# Patient Record
Sex: Female | Born: 1990 | Hispanic: Yes | Marital: Married | Smoking: Never smoker
Health system: Southern US, Community
[De-identification: ages and names within clinical notes are randomized; demographics above are authoritative.]

## PROBLEM LIST (undated history)

## (undated) ENCOUNTER — Inpatient Hospital Stay: Payer: Self-pay

## (undated) DIAGNOSIS — Z8279 Family history of other congenital malformations, deformations and chromosomal abnormalities: Secondary | ICD-10-CM

## (undated) HISTORY — PX: NO PAST SURGERIES: SHX2092

## (undated) HISTORY — DX: Family history of other congenital malformations, deformations and chromosomal abnormalities: Z82.79

---
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
## 2010-05-15 ENCOUNTER — Emergency Department: Payer: Self-pay | Admitting: Emergency Medicine

## 2011-04-09 ENCOUNTER — Emergency Department: Payer: Self-pay | Admitting: Emergency Medicine

## 2012-11-26 ENCOUNTER — Emergency Department: Payer: Self-pay | Admitting: Emergency Medicine

## 2014-06-28 ENCOUNTER — Ambulatory Visit: Payer: Self-pay | Admitting: Otolaryngology

## 2016-01-29 DIAGNOSIS — N941 Unspecified dyspareunia: Secondary | ICD-10-CM | POA: Diagnosis not present

## 2016-01-29 DIAGNOSIS — Z3202 Encounter for pregnancy test, result negative: Secondary | ICD-10-CM | POA: Diagnosis not present

## 2016-01-29 DIAGNOSIS — Z30431 Encounter for routine checking of intrauterine contraceptive device: Secondary | ICD-10-CM | POA: Diagnosis not present

## 2016-02-05 DIAGNOSIS — R1032 Left lower quadrant pain: Secondary | ICD-10-CM | POA: Diagnosis not present

## 2016-02-05 DIAGNOSIS — N941 Unspecified dyspareunia: Secondary | ICD-10-CM | POA: Diagnosis not present

## 2016-02-05 DIAGNOSIS — N83202 Unspecified ovarian cyst, left side: Secondary | ICD-10-CM | POA: Diagnosis not present

## 2016-04-01 DIAGNOSIS — M5412 Radiculopathy, cervical region: Secondary | ICD-10-CM | POA: Diagnosis not present

## 2016-04-01 DIAGNOSIS — M9901 Segmental and somatic dysfunction of cervical region: Secondary | ICD-10-CM | POA: Diagnosis not present

## 2016-04-01 DIAGNOSIS — M62830 Muscle spasm of back: Secondary | ICD-10-CM | POA: Diagnosis not present

## 2016-04-01 DIAGNOSIS — M9902 Segmental and somatic dysfunction of thoracic region: Secondary | ICD-10-CM | POA: Diagnosis not present

## 2016-04-08 DIAGNOSIS — M62830 Muscle spasm of back: Secondary | ICD-10-CM | POA: Diagnosis not present

## 2016-04-08 DIAGNOSIS — M5412 Radiculopathy, cervical region: Secondary | ICD-10-CM | POA: Diagnosis not present

## 2016-04-08 DIAGNOSIS — M9901 Segmental and somatic dysfunction of cervical region: Secondary | ICD-10-CM | POA: Diagnosis not present

## 2016-04-08 DIAGNOSIS — M9902 Segmental and somatic dysfunction of thoracic region: Secondary | ICD-10-CM | POA: Diagnosis not present

## 2016-04-10 DIAGNOSIS — M9901 Segmental and somatic dysfunction of cervical region: Secondary | ICD-10-CM | POA: Diagnosis not present

## 2016-04-10 DIAGNOSIS — M9902 Segmental and somatic dysfunction of thoracic region: Secondary | ICD-10-CM | POA: Diagnosis not present

## 2016-04-10 DIAGNOSIS — M62830 Muscle spasm of back: Secondary | ICD-10-CM | POA: Diagnosis not present

## 2016-04-10 DIAGNOSIS — M5412 Radiculopathy, cervical region: Secondary | ICD-10-CM | POA: Diagnosis not present

## 2016-04-13 DIAGNOSIS — M9901 Segmental and somatic dysfunction of cervical region: Secondary | ICD-10-CM | POA: Diagnosis not present

## 2016-04-13 DIAGNOSIS — M62830 Muscle spasm of back: Secondary | ICD-10-CM | POA: Diagnosis not present

## 2016-04-13 DIAGNOSIS — M5412 Radiculopathy, cervical region: Secondary | ICD-10-CM | POA: Diagnosis not present

## 2016-04-13 DIAGNOSIS — M9902 Segmental and somatic dysfunction of thoracic region: Secondary | ICD-10-CM | POA: Diagnosis not present

## 2016-04-15 DIAGNOSIS — R1032 Left lower quadrant pain: Secondary | ICD-10-CM | POA: Diagnosis not present

## 2016-04-15 DIAGNOSIS — Z30432 Encounter for removal of intrauterine contraceptive device: Secondary | ICD-10-CM | POA: Diagnosis not present

## 2016-04-15 DIAGNOSIS — N83201 Unspecified ovarian cyst, right side: Secondary | ICD-10-CM | POA: Diagnosis not present

## 2016-08-18 DIAGNOSIS — Z331 Pregnant state, incidental: Secondary | ICD-10-CM | POA: Diagnosis not present

## 2016-08-18 DIAGNOSIS — Z113 Encounter for screening for infections with a predominantly sexual mode of transmission: Secondary | ICD-10-CM | POA: Diagnosis not present

## 2016-08-18 DIAGNOSIS — Z124 Encounter for screening for malignant neoplasm of cervix: Secondary | ICD-10-CM | POA: Diagnosis not present

## 2016-08-18 DIAGNOSIS — Z3480 Encounter for supervision of other normal pregnancy, unspecified trimester: Secondary | ICD-10-CM | POA: Diagnosis not present

## 2016-08-18 LAB — OB RESULTS CONSOLE ABO/RH: RH Type: POSITIVE

## 2016-08-18 LAB — OB RESULTS CONSOLE RPR: RPR: NONREACTIVE

## 2016-08-18 LAB — OB RESULTS CONSOLE HIV ANTIBODY (ROUTINE TESTING): HIV: NONREACTIVE

## 2016-08-18 LAB — HM PAP SMEAR

## 2016-08-18 LAB — OB RESULTS CONSOLE PLATELET COUNT: PLATELETS: 226 10*3/uL

## 2016-08-18 LAB — OB RESULTS CONSOLE HGB/HCT, BLOOD
HEMATOCRIT: 35 %
Hemoglobin: 12.3 g/dL

## 2016-08-18 LAB — OB RESULTS CONSOLE ANTIBODY SCREEN: Antibody Screen: NEGATIVE

## 2016-08-18 LAB — OB RESULTS CONSOLE RUBELLA ANTIBODY, IGM: RUBELLA: IMMUNE

## 2016-08-18 LAB — OB RESULTS CONSOLE HEPATITIS B SURFACE ANTIGEN: Hepatitis B Surface Ag: NEGATIVE

## 2016-08-18 LAB — OB RESULTS CONSOLE VARICELLA ZOSTER ANTIBODY, IGG: Varicella: IMMUNE

## 2016-08-18 LAB — OB RESULTS CONSOLE GC/CHLAMYDIA
CHLAMYDIA, DNA PROBE: NEGATIVE
GC PROBE AMP, GENITAL: NEGATIVE

## 2016-09-04 DIAGNOSIS — Z3687 Encounter for antenatal screening for uncertain dates: Secondary | ICD-10-CM | POA: Diagnosis not present

## 2016-09-04 DIAGNOSIS — Z3A11 11 weeks gestation of pregnancy: Secondary | ICD-10-CM | POA: Diagnosis not present

## 2016-09-04 DIAGNOSIS — Z8279 Family history of other congenital malformations, deformations and chromosomal abnormalities: Secondary | ICD-10-CM | POA: Diagnosis not present

## 2016-10-05 NOTE — L&D Delivery Note (Signed)
Delivery Note At 9:40 PM a viable female infant was delivered via Vaginal, Spontaneous Delivery (Presentation: ROA).  APGAR: 8, 9 ; weight pending .   Placenta status: delivered spontaneously, intact.  Cord: 3VC with the following complications: none.  Cord pH: n/a  Anesthesia:  epidural Episiotomy: None Lacerations: 1st degree;Perineal Suture Repair: not repaired, hemostatic Est. Blood Loss (mL): 350  Mom to postpartum.  Baby to Couplet care / Skin to Skin.  Called to see patient.  Mom pushed to deliver a viable female infant.  The head followed by shoulders, which delivered without difficulty, and the rest of the body.  A single nuchal cord noted and reduced.  Baby to mom's chest.  Cord clamped and cut after > 1 min delay.  Cord blood obtained.  Placenta delivered spontaneously, intact, with a 3-vessel cord.  First degree perineal laceration repaired with 3-0 Vicryl in standard fashion.  Rest of vagina and cervix inspected without lacerations noted. All counts correct.  Hemostasis obtained with IV pitocin and fundal massage. EBL 350 mL.     Thomasene MohairStephen Cherly Erno, MD 04/04/2017, 9:54 PM

## 2016-11-04 DIAGNOSIS — Z3A19 19 weeks gestation of pregnancy: Secondary | ICD-10-CM | POA: Diagnosis not present

## 2016-11-04 DIAGNOSIS — Z8279 Family history of other congenital malformations, deformations and chromosomal abnormalities: Secondary | ICD-10-CM | POA: Diagnosis not present

## 2016-11-04 DIAGNOSIS — Z369 Encounter for antenatal screening, unspecified: Secondary | ICD-10-CM | POA: Diagnosis not present

## 2016-11-04 DIAGNOSIS — Z23 Encounter for immunization: Secondary | ICD-10-CM | POA: Diagnosis not present

## 2016-11-23 DIAGNOSIS — Z3A22 22 weeks gestation of pregnancy: Secondary | ICD-10-CM | POA: Diagnosis not present

## 2016-11-23 DIAGNOSIS — Z362 Encounter for other antenatal screening follow-up: Secondary | ICD-10-CM | POA: Diagnosis not present

## 2016-11-26 DIAGNOSIS — Z3492 Encounter for supervision of normal pregnancy, unspecified, second trimester: Secondary | ICD-10-CM | POA: Diagnosis not present

## 2016-11-26 DIAGNOSIS — Z0000 Encounter for general adult medical examination without abnormal findings: Secondary | ICD-10-CM | POA: Diagnosis not present

## 2016-11-26 DIAGNOSIS — Z3A22 22 weeks gestation of pregnancy: Secondary | ICD-10-CM | POA: Diagnosis not present

## 2016-11-26 LAB — LIPID PANEL
CHOLESTEROL: 202 mg/dL — AB (ref 0–200)
HDL: 79 mg/dL — AB (ref 35–70)
LDL Cholesterol: 89 mg/dL
Triglycerides: 169 mg/dL — AB (ref 40–160)

## 2016-12-15 ENCOUNTER — Observation Stay
Admission: EM | Admit: 2016-12-15 | Discharge: 2016-12-16 | Disposition: A | Discharge status: Home / Self Care | Payer: BLUE CROSS/BLUE SHIELD | Attending: Obstetrics and Gynecology | Admitting: Obstetrics and Gynecology

## 2016-12-15 DIAGNOSIS — R109 Unspecified abdominal pain: Secondary | ICD-10-CM | POA: Diagnosis not present

## 2016-12-15 DIAGNOSIS — O26892 Other specified pregnancy related conditions, second trimester: Secondary | ICD-10-CM | POA: Diagnosis not present

## 2016-12-15 DIAGNOSIS — O9989 Other specified diseases and conditions complicating pregnancy, childbirth and the puerperium: Secondary | ICD-10-CM | POA: Diagnosis not present

## 2016-12-15 DIAGNOSIS — Z3A25 25 weeks gestation of pregnancy: Secondary | ICD-10-CM | POA: Insufficient documentation

## 2016-12-15 DIAGNOSIS — R102 Pelvic and perineal pain: Secondary | ICD-10-CM | POA: Diagnosis not present

## 2016-12-15 MED ORDER — ACETAMINOPHEN 325 MG PO TABS: 650.0000 mg | ORAL_TABLET | ORAL | Status: DC | PRN

## 2016-12-16 DIAGNOSIS — R102 Pelvic and perineal pain: Secondary | ICD-10-CM | POA: Diagnosis not present

## 2016-12-16 DIAGNOSIS — Z3A25 25 weeks gestation of pregnancy: Secondary | ICD-10-CM | POA: Diagnosis not present

## 2016-12-16 DIAGNOSIS — O26892 Other specified pregnancy related conditions, second trimester: Secondary | ICD-10-CM | POA: Diagnosis not present

## 2016-12-16 NOTE — Discharge Instructions (Signed)
Round Ligament Pain The round ligament is a cord of muscle and tissue that helps to support the uterus. It can become a source of pain during pregnancy if it becomes stretched or twisted as the baby grows. The pain usually begins in the second trimester of pregnancy, and it can come and go until the baby is delivered. It is not a serious problem, and it does not cause harm to the baby. Round ligament pain is usually a short, sharp, and pinching pain, but it can also be a dull, lingering, and aching pain. The pain is felt in the lower side of the abdomen or in the groin. It usually starts deep in the groin and moves up to the outside of the hip area. Pain can occur with:  A sudden change in position.  Rolling over in bed.  Coughing or sneezing.  Physical activity. Follow these instructions at home: Watch your condition for any changes. Take these steps to help with your pain:  When the pain starts, relax. Then try:  Sitting down.  Flexing your knees up to your abdomen.  Lying on your side with one pillow under your abdomen and another pillow between your legs.  Sitting in a warm bath for 15-20 minutes or until the pain goes away.  Take over-the-counter and prescription medicines only as told by your health care provider.  Move slowly when you sit and stand.  Avoid long walks if they cause pain.  Stop or lessen your physical activities if they cause pain. Contact a health care provider if:  Your pain does not go away with treatment.  You feel pain in your back that you did not have before.  Your medicine is not helping. Get help right away if:  You develop a fever or chills.  You develop uterine contractions.  You develop vaginal bleeding.  You develop nausea or vomiting.  You develop diarrhea.  You have pain when you urinate. This information is not intended to replace advice given to you by your health care provider. Make sure you discuss any questions you have with  your health care provider. Document Released: 06/30/2008 Document Revised: 02/27/2016 Document Reviewed: 11/28/2014 Elsevier Interactive Patient Education  2017 [employer]  

## 2016-12-16 NOTE — Progress Notes (Signed)
Patient came in complaining of constant vaginal pressure and lower abdominal pain for the last month. Patient states previously she was able to alleviate pain by changing position but has increased over the last 2 weeks to the point where she cant get comfortable and rates pain 10/10. Characterizes pain as pulling, aching and shooting in groin and vagina radiating to lower back. Also has occasional numbness in left leg.  Endorses fetal movement. Denies vaginal bleeding, LOF.  Monitors applied.

## 2016-12-16 NOTE — Final Progress Note (Signed)
Physician Final Progress Note  Patient ID: Vicki Lutz MRN: [id number] DOB/AGE: 26/04/1991 25 y.o.  Admit date: 12/15/2016 Admitting provider: Vena AustriaAndreas Shontay Wallner, MD Discharge date: 12/16/2016   Admission Diagnoses: lower abdominal pain   Discharge Diagnoses:  Active Problems:   Labor and delivery indication for care or intervention round ligament pain  26 yo G3P1 at 461w5d presenting with bilateral lower quadrant/groin pain, described as sharp, worse with movement.  No dysuria, some lumbago no CVA, no no hematuria.  +FM, no LOF, no VB, no ctx  Consults: None  Significant Findings/ Diagnostic Studies: none  Procedures:  150, moderate, no accels, no decels no contractions on tocometer   Discharge Condition: good  Disposition: Final discharge disposition not confirmed  Diet: Regular diet  Discharge Activity: Activity as tolerated  Discharge Instructions    Discharge activity:  No Restrictions    Complete by:  As directed    Discharge diet:  No restrictions    Complete by:  As directed    No sexual activity restrictions    Complete by:  As directed    Notify physician for a general feeling that "something is not right"    Complete by:  As directed    Notify physician for increase or change in vaginal discharge    Complete by:  As directed    Notify physician for intestinal cramps, with or without diarrhea, sometimes described as "gas pain"    Complete by:  As directed    Notify physician for leaking of fluid    Complete by:  As directed    Notify physician for low, dull backache, unrelieved by heat or Tylenol    Complete by:  As directed    Notify physician for menstrual like cramps    Complete by:  As directed    Notify physician for pelvic pressure    Complete by:  As directed    Notify physician for uterine contractions.  These may be painless and feel like the uterus is tightening or the baby is  "balling up"    Complete by:  As directed    Notify physician for  vaginal bleeding    Complete by:  As directed    PRETERM LABOR:  Includes any of the follwing symptoms that occur between 20 - [redacted] weeks gestation.  If these symptoms are not stopped, preterm labor can result in preterm delivery, placing your baby at risk    Complete by:  As directed      Allergies as of 12/16/2016   Not on File     Medication List    You have not been prescribed any medications.      Total time spent taking care of this patient: 30 minutes  Signed: Drinda ButtsAmdreas Jacqulene Lutz 12/16/2016, 12:19 AM

## 2016-12-16 NOTE — Progress Notes (Signed)
Patient declines tylenol, heating pad at this time, states she has both at home. Discharge instructions reviewed with patient, no questions at this time. Patient escorted via wheelchair to emergency room lobby without difficulty at her request. Patient and husband ambulatory out of hospital at this time.

## 2016-12-17 ENCOUNTER — Ambulatory Visit (INDEPENDENT_AMBULATORY_CARE_PROVIDER_SITE_OTHER): Payer: BLUE CROSS/BLUE SHIELD | Admitting: Obstetrics and Gynecology

## 2016-12-17 VITALS — BP 100/62 | Ht 61.0 in | Wt 152.0 lb

## 2016-12-17 DIAGNOSIS — R109 Unspecified abdominal pain: Secondary | ICD-10-CM

## 2016-12-17 DIAGNOSIS — O26892 Other specified pregnancy related conditions, second trimester: Secondary | ICD-10-CM

## 2016-12-17 LAB — POCT URINALYSIS DIPSTICK
Bilirubin, UA: NEGATIVE
Glucose, UA: NEGATIVE
Ketones, UA: NEGATIVE
Leukocytes, UA: NEGATIVE
NITRITE UA: NEGATIVE
RBC UA: NEGATIVE
Spec Grav, UA: 1.01
UROBILINOGEN UA: NEGATIVE
pH, UA: 7

## 2016-12-17 NOTE — Progress Notes (Signed)
Pt complains of severe low abdomen pain unrelieved by Tylenol or heat.   The patient complains of worsening abdominal pain and pressure over the past month. She describes the pain as a heavy pressure that is always present. The pain level is 6/10 at best and 10/10 at worst. The pain is located in her lower abdomen worse on her left side and radiates down her left leg. Lying inserted and positions makes the pain a little better as well as using a heating pad. She has tried Tylenol with slight improvement. She has not identified activities or thing she can do to make the pain worse she denies any associated symptoms. She denies fever, chills, headache, nausea, vomiting, constipation, diarrhea, urinary symptoms, vaginal symptoms. She has a good appetite and is able to eat. Exam: Abdomen: Soft, mildly tender to palpation in the left lower quadrant, positive bowel sounds, no rebound, no guarding, no masses felt To the uterus. There is no uterine tenderness to palpation. Plan: Urinalysis negative for infection. Will obtain ultrasound to assess for adnexal cysts. Precautions for worsening pain were discussed with patient. She is to go to the hospital if pain progresses and does not relent. She voiced understanding and agreement with this plan. The ultrasound will also asses for concealed abruption. Mutual agreement to defer her pelvic exam today.

## 2016-12-18 ENCOUNTER — Ambulatory Visit (INDEPENDENT_AMBULATORY_CARE_PROVIDER_SITE_OTHER): Payer: BLUE CROSS/BLUE SHIELD | Admitting: Obstetrics and Gynecology

## 2016-12-18 ENCOUNTER — Ambulatory Visit (INDEPENDENT_AMBULATORY_CARE_PROVIDER_SITE_OTHER): Payer: BLUE CROSS/BLUE SHIELD

## 2016-12-18 VITALS — BP 100/60 | Wt 149.0 lb

## 2016-12-18 DIAGNOSIS — R109 Unspecified abdominal pain: Secondary | ICD-10-CM

## 2016-12-18 DIAGNOSIS — Z3A26 26 weeks gestation of pregnancy: Secondary | ICD-10-CM

## 2016-12-18 DIAGNOSIS — O26892 Other specified pregnancy related conditions, second trimester: Secondary | ICD-10-CM | POA: Diagnosis not present

## 2016-12-18 NOTE — Progress Notes (Signed)
U/S today shows ?retroplacental hematoma.  See report.  Pt has had absolutenly no bleeding, no ctx, no uterine tenderness. I believe this is less likely a hematoma and more likely a venous sinus near the endometrium. To be safe, patient given precautions for vaginal bleeding or worsening sx.  Patient to keep u/s appt next week to have this rechecked.

## 2016-12-24 ENCOUNTER — Ambulatory Visit (INDEPENDENT_AMBULATORY_CARE_PROVIDER_SITE_OTHER): Payer: BLUE CROSS/BLUE SHIELD | Admitting: Obstetrics and Gynecology

## 2016-12-24 ENCOUNTER — Other Ambulatory Visit: Payer: Self-pay | Admitting: Obstetrics and Gynecology

## 2016-12-24 ENCOUNTER — Ambulatory Visit (INDEPENDENT_AMBULATORY_CARE_PROVIDER_SITE_OTHER): Payer: BLUE CROSS/BLUE SHIELD

## 2016-12-24 VITALS — BP 104/62 | Wt 151.0 lb

## 2016-12-24 DIAGNOSIS — R109 Unspecified abdominal pain: Secondary | ICD-10-CM

## 2016-12-24 DIAGNOSIS — Z131 Encounter for screening for diabetes mellitus: Secondary | ICD-10-CM

## 2016-12-24 DIAGNOSIS — Z3689 Encounter for other specified antenatal screening: Secondary | ICD-10-CM

## 2016-12-24 DIAGNOSIS — Z3A26 26 weeks gestation of pregnancy: Secondary | ICD-10-CM

## 2016-12-24 DIAGNOSIS — Z3482 Encounter for supervision of other normal pregnancy, second trimester: Secondary | ICD-10-CM

## 2016-12-24 DIAGNOSIS — Z113 Encounter for screening for infections with a predominantly sexual mode of transmission: Secondary | ICD-10-CM

## 2016-12-24 DIAGNOSIS — O26892 Other specified pregnancy related conditions, second trimester: Secondary | ICD-10-CM

## 2016-12-24 DIAGNOSIS — Z3A20 20 weeks gestation of pregnancy: Secondary | ICD-10-CM | POA: Diagnosis not present

## 2016-12-24 NOTE — Progress Notes (Signed)
u/s shows decrease in size of ?retroplacenta hematoma (was 0.92 x 6cm - now is 0.8 x 3.3cm) No vb. No lof. Anatomy scan still incomplete for RVOT. Will repeat in 2 weeks to see RVOT and assess retroplacental hematoma. 28 week labs nv.

## 2017-01-11 ENCOUNTER — Encounter: Payer: BLUE CROSS/BLUE SHIELD | Admitting: Certified Nurse Midwife

## 2017-01-11 ENCOUNTER — Other Ambulatory Visit: Payer: BLUE CROSS/BLUE SHIELD

## 2017-01-12 ENCOUNTER — Ambulatory Visit (INDEPENDENT_AMBULATORY_CARE_PROVIDER_SITE_OTHER): Payer: BLUE CROSS/BLUE SHIELD

## 2017-01-12 ENCOUNTER — Ambulatory Visit: Payer: BLUE CROSS/BLUE SHIELD | Admitting: Obstetrics and Gynecology

## 2017-01-12 DIAGNOSIS — Z131 Encounter for screening for diabetes mellitus: Secondary | ICD-10-CM

## 2017-01-12 DIAGNOSIS — Z113 Encounter for screening for infections with a predominantly sexual mode of transmission: Secondary | ICD-10-CM

## 2017-01-12 DIAGNOSIS — Z3482 Encounter for supervision of other normal pregnancy, second trimester: Secondary | ICD-10-CM | POA: Diagnosis not present

## 2017-01-13 ENCOUNTER — Ambulatory Visit (INDEPENDENT_AMBULATORY_CARE_PROVIDER_SITE_OTHER): Payer: BLUE CROSS/BLUE SHIELD | Admitting: Obstetrics and Gynecology

## 2017-01-13 ENCOUNTER — Encounter: Payer: Self-pay | Admitting: Obstetrics and Gynecology

## 2017-01-13 VITALS — BP 104/56 | Wt 156.0 lb

## 2017-01-13 DIAGNOSIS — Z113 Encounter for screening for infections with a predominantly sexual mode of transmission: Secondary | ICD-10-CM

## 2017-01-13 DIAGNOSIS — Z3483 Encounter for supervision of other normal pregnancy, third trimester: Secondary | ICD-10-CM

## 2017-01-13 DIAGNOSIS — Z3A28 28 weeks gestation of pregnancy: Secondary | ICD-10-CM

## 2017-01-13 NOTE — Patient Instructions (Signed)
Third Trimester of Pregnancy The third trimester is from week 28 through week 40 (months 7 through 9). The third trimester is a time when the unborn baby (fetus) is growing rapidly. At the end of the ninth month, the fetus is about 20 inches in length and weighs 6-10 pounds. Body changes during your third trimester Your body will continue to go through many changes during pregnancy. The changes vary from woman to woman. During the third trimester:  Your weight will continue to increase. You can expect to gain 25-35 pounds (11-16 kg) by the end of the pregnancy.  You may begin to get stretch marks on your hips, abdomen, and breasts.  You may urinate more often because the fetus is moving lower into your pelvis and pressing on your bladder.  You may develop or continue to have heartburn. This is caused by increased hormones that slow down muscles in the digestive tract.  You may develop or continue to have constipation because increased hormones slow digestion and cause the muscles that push waste through your intestines to relax.  You may develop hemorrhoids. These are swollen veins (varicose veins) in the rectum that can itch or be painful.  You may develop swollen, bulging veins (varicose veins) in your legs.  You may have increased body aches in the pelvis, back, or thighs. This is due to weight gain and increased hormones that are relaxing your joints.  You may have changes in your hair. These can include thickening of your hair, rapid growth, and changes in texture. Some women also have hair loss during or after pregnancy, or hair that feels dry or thin. Your hair will most likely return to normal after your baby is born.  Your breasts will continue to grow and they will continue to become tender. A yellow fluid (colostrum) may leak from your breasts. This is the first milk you are producing for your baby.  Your belly button may stick out.  You may notice more swelling in your hands,  face, or ankles.  You may have increased tingling or numbness in your hands, arms, and legs. The skin on your belly may also feel numb.  You may feel short of breath because of your expanding uterus.  You may have more problems sleeping. This can be caused by the size of your belly, increased need to urinate, and an increase in your body's metabolism.  You may notice the fetus "dropping," or moving lower in your abdomen (lightening).  You may have increased vaginal discharge.  You may notice your joints feel loose and you may have pain around your pelvic bone.  What to expect at prenatal visits You will have prenatal exams every 2 weeks until week 36. Then you will have weekly prenatal exams. During a routine prenatal visit:  You will be weighed to make sure you and the baby are growing normally.  Your blood pressure will be taken.  Your abdomen will be measured to track your baby's growth.  The fetal heartbeat will be listened to.  Any test results from the previous visit will be discussed.  You may have a cervical check near your due date to see if your cervix has softened or thinned (effaced).  You will be tested for Group B streptococcus. This happens between 35 and 37 weeks.  Your health care provider may ask you:  What your birth plan is.  How you are feeling.  If you are feeling the baby move.  If you have had   any abnormal symptoms, such as leaking fluid, bleeding, severe headaches, or abdominal cramping.  If you are using any tobacco products, including cigarettes, chewing tobacco, and electronic cigarettes.  If you have any questions.  Other tests or screenings that may be performed during your third trimester include:  Blood tests that check for low iron levels (anemia).  Fetal testing to check the health, activity level, and growth of the fetus. Testing is done if you have certain medical conditions or if there are problems during the  pregnancy.  Nonstress test (NST). This test checks the health of your baby to make sure there are no signs of problems, such as the baby not getting enough oxygen. During this test, a belt is placed around your belly. The baby is made to move, and its heart rate is monitored during movement.  What is false labor? False labor is a condition in which you feel small, irregular tightenings of the muscles in the womb (contractions) that usually go away with rest, changing position, or drinking water. These are called Braxton Hicks contractions. Contractions may last for hours, days, or even weeks before true labor sets in. If contractions come at regular intervals, become more frequent, increase in intensity, or become painful, you should see your health care provider. What are the signs of labor?  Abdominal cramps.  Regular contractions that start at 10 minutes apart and become stronger and more frequent with time.  Contractions that start on the top of the uterus and spread down to the lower abdomen and back.  Increased pelvic pressure and dull back pain.  A watery or bloody mucus discharge that comes from the vagina.  Leaking of amniotic fluid. This is also known as your "water breaking." It could be a slow trickle or a gush. Let your health care provider know if it has a color or strange odor. If you have any of these signs, call your health care provider right away, even if it is before your due date. Follow these instructions at home: Medicines  Follow your health care provider's instructions regarding medicine use. Specific medicines may be either safe or unsafe to take during pregnancy.  Take a prenatal vitamin that contains at least 600 micrograms (mcg) of folic acid.  If you develop constipation, try taking a stool softener if your health care provider approves. Eating and drinking  Eat a balanced diet that includes fresh fruits and vegetables, whole grains, good sources of protein  such as meat, eggs, or tofu, and low-fat dairy. Your health care provider will help you determine the amount of weight gain that is right for you.  Avoid raw meat and uncooked cheese. These carry germs that can cause birth defects in the baby.  If you have low calcium intake from food, talk to your health care provider about whether you should take a daily calcium supplement.  Eat four or five small meals rather than three large meals a day.  Limit foods that are high in fat and processed sugars, such as fried and sweet foods.  To prevent constipation: ? Drink enough fluid to keep your urine clear or pale yellow. ? Eat foods that are high in fiber, such as fresh fruits and vegetables, whole grains, and beans. Activity  Exercise only as directed by your health care provider. Most women can continue their usual exercise routine during pregnancy. Try to exercise for 30 minutes at least 5 days a week. Stop exercising if you experience uterine contractions.  Avoid heavy   lifting.  Do not exercise in extreme heat or humidity, or at high altitudes.  Wear low-heel, comfortable shoes.  Practice good posture.  You may continue to have sex unless your health care provider tells you otherwise. Relieving pain and discomfort  Take frequent breaks and rest with your legs elevated if you have leg cramps or low back pain.  Take warm sitz baths to soothe any pain or discomfort caused by hemorrhoids. Use hemorrhoid cream if your health care provider approves.  Wear a good support bra to prevent discomfort from breast tenderness.  If you develop varicose veins: ? Wear support pantyhose or compression stockings as told by your healthcare provider. ? Elevate your feet for 15 minutes, 3-4 times a day. Prenatal care  Write down your questions. Take them to your prenatal visits.  Keep all your prenatal visits as told by your health care provider. This is important. Safety  Wear your seat belt at  all times when driving.  Make a list of emergency phone numbers, including numbers for family, friends, the hospital, and police and fire departments. General instructions  Avoid cat litter boxes and soil used by cats. These carry germs that can cause birth defects in the baby. If you have a cat, ask someone to clean the litter box for you.  Do not travel far distances unless it is absolutely necessary and only with the approval of your health care provider.  Do not use hot tubs, steam rooms, or saunas.  Do not drink alcohol.  Do not use any products that contain nicotine or tobacco, such as cigarettes and e-cigarettes. If you need help quitting, ask your health care provider.  Do not use any medicinal herbs or unprescribed drugs. These chemicals affect the formation and growth of the baby.  Do not douche or use tampons or scented sanitary pads.  Do not cross your legs for long periods of time.  To prepare for the arrival of your baby: ? Take prenatal classes to understand, practice, and ask questions about labor and delivery. ? Make a trial run to the hospital. ? Visit the hospital and tour the maternity area. ? Arrange for maternity or paternity leave through employers. ? Arrange for family and friends to take care of pets while you are in the hospital. ? Purchase a rear-facing car seat and make sure you know how to install it in your car. ? Pack your hospital bag. ? Prepare the baby's nursery. Make sure to remove all pillows and stuffed animals from the baby's crib to prevent suffocation.  Visit your dentist if you have not gone during your pregnancy. Use a soft toothbrush to brush your teeth and be gentle when you floss. Contact a health care provider if:  You are unsure if you are in labor or if your water has broken.  You become dizzy.  You have mild pelvic cramps, pelvic pressure, or nagging pain in your abdominal area.  You have lower back pain.  You have persistent  nausea, vomiting, or diarrhea.  You have an unusual or bad smelling vaginal discharge.  You have pain when you urinate. Get help right away if:  Your water breaks before 37 weeks.  You have regular contractions less than 5 minutes apart before 37 weeks.  You have a fever.  You are leaking fluid from your vagina.  You have spotting or bleeding from your vagina.  You have severe abdominal pain or cramping.  You have rapid weight loss or weight gain.    You have shortness of breath with chest pain.  You notice sudden or extreme swelling of your face, hands, ankles, feet, or legs.  Your baby makes fewer than 10 movements in 2 hours.  You have severe headaches that do not go away when you take medicine.  You have vision changes. Summary  The third trimester is from week 28 through week 40, months 7 through 9. The third trimester is a time when the unborn baby (fetus) is growing rapidly.  During the third trimester, your discomfort may increase as you and your baby continue to gain weight. You may have abdominal, leg, and back pain, sleeping problems, and an increased need to urinate.  During the third trimester your breasts will keep growing and they will continue to become tender. A yellow fluid (colostrum) may leak from your breasts. This is the first milk you are producing for your baby.  False labor is a condition in which you feel small, irregular tightenings of the muscles in the womb (contractions) that eventually go away. These are called Braxton Hicks contractions. Contractions may last for hours, days, or even weeks before true labor sets in.  Signs of labor can include: abdominal cramps; regular contractions that start at 10 minutes apart and become stronger and more frequent with time; watery or bloody mucus discharge that comes from the vagina; increased pelvic pressure and dull back pain; and leaking of amniotic fluid. This information is not intended to replace advice  given to you by your health care provider. Make sure you discuss any questions you have with your health care provider. Document Released: 09/15/2001 Document Revised: 02/27/2016 Document Reviewed: 11/22/2012 Elsevier Interactive Patient Education  2017 [employer]  

## 2017-01-13 NOTE — Progress Notes (Signed)
Follow up U/S today/No GTT done today

## 2017-01-20 ENCOUNTER — Ambulatory Visit (INDEPENDENT_AMBULATORY_CARE_PROVIDER_SITE_OTHER): Payer: BLUE CROSS/BLUE SHIELD | Admitting: Obstetrics and Gynecology

## 2017-01-20 ENCOUNTER — Other Ambulatory Visit: Payer: BLUE CROSS/BLUE SHIELD

## 2017-01-20 ENCOUNTER — Encounter: Payer: Self-pay | Admitting: Obstetrics and Gynecology

## 2017-01-20 VITALS — BP 100/56 | Wt 157.0 lb

## 2017-01-20 DIAGNOSIS — Z3A30 30 weeks gestation of pregnancy: Secondary | ICD-10-CM

## 2017-01-20 DIAGNOSIS — Z113 Encounter for screening for infections with a predominantly sexual mode of transmission: Secondary | ICD-10-CM | POA: Diagnosis not present

## 2017-01-20 DIAGNOSIS — Z3483 Encounter for supervision of other normal pregnancy, third trimester: Secondary | ICD-10-CM

## 2017-01-20 DIAGNOSIS — Z23 Encounter for immunization: Secondary | ICD-10-CM | POA: Diagnosis not present

## 2017-01-20 DIAGNOSIS — Z3482 Encounter for supervision of other normal pregnancy, second trimester: Secondary | ICD-10-CM | POA: Diagnosis not present

## 2017-01-20 DIAGNOSIS — Z131 Encounter for screening for diabetes mellitus: Secondary | ICD-10-CM | POA: Diagnosis not present

## 2017-01-20 NOTE — Progress Notes (Signed)
OB?

## 2017-01-20 NOTE — Patient Instructions (Signed)
Round Ligament Pain The round ligament is a cord of muscle and tissue that helps to support the uterus. It can become a source of pain during pregnancy if it becomes stretched or twisted as the baby grows. The pain usually begins in the second trimester of pregnancy, and it can come and go until the baby is delivered. It is not a serious problem, and it does not cause harm to the baby. Round ligament pain is usually a short, sharp, and pinching pain, but it can also be a dull, lingering, and aching pain. The pain is felt in the lower side of the abdomen or in the groin. It usually starts deep in the groin and moves up to the outside of the hip area. Pain can occur with:  A sudden change in position.  Rolling over in bed.  Coughing or sneezing.  Physical activity. Follow these instructions at home: Watch your condition for any changes. Take these steps to help with your pain:  When the pain starts, relax. Then try:  Sitting down.  Flexing your knees up to your abdomen.  Lying on your side with one pillow under your abdomen and another pillow between your legs.  Sitting in a warm bath for 15-20 minutes or until the pain goes away.  Take over-the-counter and prescription medicines only as told by your health care provider.  Move slowly when you sit and stand.  Avoid long walks if they cause pain.  Stop or lessen your physical activities if they cause pain. Contact a health care provider if:  Your pain does not go away with treatment.  You feel pain in your back that you did not have before.  Your medicine is not helping. Get help right away if:  You develop a fever or chills.  You develop uterine contractions.  You develop vaginal bleeding.  You develop nausea or vomiting.  You develop diarrhea.  You have pain when you urinate. This information is not intended to replace advice given to you by your health care provider. Make sure you discuss any questions you have with  your health care provider. Document Released: 06/30/2008 Document Revised: 02/27/2016 Document Reviewed: 11/28/2014 Elsevier Interactive Patient Education  2017 [employer]. Third Trimester of Pregnancy The third trimester is from week 28 through week 40 (months 7 through 9). The third trimester is a time when the unborn baby (fetus) is growing rapidly. At the end of the ninth month, the fetus is about 20 inches in length and weighs 6-10 pounds. Body changes during your third trimester Your body will continue to go through many changes during pregnancy. The changes vary from woman to woman. During the third trimester:  Your weight will continue to increase. You can expect to gain 25-35 pounds (11-16 kg) by the end of the pregnancy.  You may begin to get stretch marks on your hips, abdomen, and breasts.  You may urinate more often because the fetus is moving lower into your pelvis and pressing on your bladder.  You may develop or continue to have heartburn. This is caused by increased hormones that slow down muscles in the digestive tract.  You may develop or continue to have constipation because increased hormones slow digestion and cause the muscles that push waste through your intestines to relax.  You may develop hemorrhoids. These are swollen veins (varicose veins) in the rectum that can itch or be painful.  You may develop swollen, bulging veins (varicose veins) in your legs.  You may  have increased body aches in the pelvis, back, or thighs. This is due to weight gain and increased hormones that are relaxing your joints.  You may have changes in your hair. These can include thickening of your hair, rapid growth, and changes in texture. Some women also have hair loss during or after pregnancy, or hair that feels dry or thin. Your hair will most likely return to normal after your baby is born.  Your breasts will continue to grow and they will continue to become tender. A yellow fluid  (colostrum) may leak from your breasts. This is the first milk you are producing for your baby.  Your belly button may stick out.  You may notice more swelling in your hands, face, or ankles.  You may have increased tingling or numbness in your hands, arms, and legs. The skin on your belly may also feel numb.  You may feel short of breath because of your expanding uterus.  You may have more problems sleeping. This can be caused by the size of your belly, increased need to urinate, and an increase in your body's metabolism.  You may notice the fetus "dropping," or moving lower in your abdomen (lightening).  You may have increased vaginal discharge.  You may notice your joints feel loose and you may have pain around your pelvic bone. What to expect at prenatal visits You will have prenatal exams every 2 weeks until week 36. Then you will have weekly prenatal exams. During a routine prenatal visit:  You will be weighed to make sure you and the baby are growing normally.  Your blood pressure will be taken.  Your abdomen will be measured to track your baby's growth.  The fetal heartbeat will be listened to.  Any test results from the previous visit will be discussed.  You may have a cervical check near your due date to see if your cervix has softened or thinned (effaced).  You will be tested for Group B streptococcus. This happens between 35 and 37 weeks. Your health care provider may ask you:  What your birth plan is.  How you are feeling.  If you are feeling the baby move.  If you have had any abnormal symptoms, such as leaking fluid, bleeding, severe headaches, or abdominal cramping.  If you are using any tobacco products, including cigarettes, chewing tobacco, and electronic cigarettes.  If you have any questions. Other tests or screenings that may be performed during your third trimester include:  Blood tests that check for low iron levels (anemia).  Fetal testing to  check the health, activity level, and growth of the fetus. Testing is done if you have certain medical conditions or if there are problems during the pregnancy.  Nonstress test (NST). This test checks the health of your baby to make sure there are no signs of problems, such as the baby not getting enough oxygen. During this test, a belt is placed around your belly. The baby is made to move, and its heart rate is monitored during movement. What is false labor? False labor is a condition in which you feel small, irregular tightenings of the muscles in the womb (contractions) that usually go away with rest, changing position, or drinking water. These are called Braxton Hicks contractions. Contractions may last for hours, days, or even weeks before true labor sets in. If contractions come at regular intervals, become more frequent, increase in intensity, or become painful, you should see your health care provider. What are the  signs of labor?  Abdominal cramps.  Regular contractions that start at 10 minutes apart and become stronger and more frequent with time.  Contractions that start on the top of the uterus and spread down to the lower abdomen and back.  Increased pelvic pressure and dull back pain.  A watery or bloody mucus discharge that comes from the vagina.  Leaking of amniotic fluid. This is also known as your "water breaking." It could be a slow trickle or a gush. Let your health care provider know if it has a color or strange odor. If you have any of these signs, call your health care provider right away, even if it is before your due date. Follow these instructions at home: Medicines   Follow your health care provider's instructions regarding medicine use. Specific medicines may be either safe or unsafe to take during pregnancy.  Take a prenatal vitamin that contains at least 600 micrograms (mcg) of folic acid.  If you develop constipation, try taking a stool softener if your health  care provider approves. Eating and drinking   Eat a balanced diet that includes fresh fruits and vegetables, whole grains, good sources of protein such as meat, eggs, or tofu, and low-fat dairy. Your health care provider will help you determine the amount of weight gain that is right for you.  Avoid raw meat and uncooked cheese. These carry germs that can cause birth defects in the baby.  If you have low calcium intake from food, talk to your health care provider about whether you should take a daily calcium supplement.  Eat four or five small meals rather than three large meals a day.  Limit foods that are high in fat and processed sugars, such as fried and sweet foods.  To prevent constipation:  Drink enough fluid to keep your urine clear or pale yellow.  Eat foods that are high in fiber, such as fresh fruits and vegetables, whole grains, and beans. Activity   Exercise only as directed by your health care provider. Most women can continue their usual exercise routine during pregnancy. Try to exercise for 30 minutes at least 5 days a week. Stop exercising if you experience uterine contractions.  Avoid heavy lifting.  Do not exercise in extreme heat or humidity, or at high altitudes.  Wear low-heel, comfortable shoes.  Practice good posture.  You may continue to have sex unless your health care provider tells you otherwise. Relieving pain and discomfort   Take frequent breaks and rest with your legs elevated if you have leg cramps or low back pain.  Take warm sitz baths to soothe any pain or discomfort caused by hemorrhoids. Use hemorrhoid cream if your health care provider approves.  Wear a good support bra to prevent discomfort from breast tenderness.  If you develop varicose veins:  Wear support pantyhose or compression stockings as told by your healthcare provider.  Elevate your feet for 15 minutes, 3-4 times a day. Prenatal care   Write down your questions. Take  them to your prenatal visits.  Keep all your prenatal visits as told by your health care provider. This is important. Safety   Wear your seat belt at all times when driving.  Make a list of emergency phone numbers, including numbers for family, friends, the hospital, and police and fire departments. General instructions   Avoid cat litter boxes and soil used by cats. These carry germs that can cause birth defects in the baby. If you have a cat, ask  someone to clean the litter box for you.  Do not travel far distances unless it is absolutely necessary and only with the approval of your health care provider.  Do not use hot tubs, steam rooms, or saunas.  Do not drink alcohol.  Do not use any products that contain nicotine or tobacco, such as cigarettes and e-cigarettes. If you need help quitting, ask your health care provider.  Do not use any medicinal herbs or unprescribed drugs. These chemicals affect the formation and growth of the baby.  Do not douche or use tampons or scented sanitary pads.  Do not cross your legs for long periods of time.  To prepare for the arrival of your baby:  Take prenatal classes to understand, practice, and ask questions about labor and delivery.  Make a trial run to the hospital.  Visit the hospital and tour the maternity area.  Arrange for maternity or paternity leave through employers.  Arrange for family and friends to take care of pets while you are in the hospital.  Purchase a rear-facing car seat and make sure you know how to install it in your car.  Pack your hospital bag.  Prepare the baby's nursery. Make sure to remove all pillows and stuffed animals from the baby's crib to prevent suffocation.  Visit your dentist if you have not gone during your pregnancy. Use a soft toothbrush to brush your teeth and be gentle when you floss. Contact a health care provider if:  You are unsure if you are in labor or if your water has broken.  You  become dizzy.  You have mild pelvic cramps, pelvic pressure, or nagging pain in your abdominal area.  You have lower back pain.  You have persistent nausea, vomiting, or diarrhea.  You have an unusual or bad smelling vaginal discharge.  You have pain when you urinate. Get help right away if:  Your water breaks before 37 weeks.  You have regular contractions less than 5 minutes apart before 37 weeks.  You have a fever.  You are leaking fluid from your vagina.  You have spotting or bleeding from your vagina.  You have severe abdominal pain or cramping.  You have rapid weight loss or weight gain.  You have shortness of breath with chest pain.  You notice sudden or extreme swelling of your face, hands, ankles, feet, or legs.  Your baby makes fewer than 10 movements in 2 hours.  You have severe headaches that do not go away when you take medicine.  You have vision changes. Summary  The third trimester is from week 28 through week 40, months 7 through 9. The third trimester is a time when the unborn baby (fetus) is growing rapidly.  During the third trimester, your discomfort may increase as you and your baby continue to gain weight. You may have abdominal, leg, and back pain, sleeping problems, and an increased need to urinate.  During the third trimester your breasts will keep growing and they will continue to become tender. A yellow fluid (colostrum) may leak from your breasts. This is the first milk you are producing for your baby.  False labor is a condition in which you feel small, irregular tightenings of the muscles in the womb (contractions) that eventually go away. These are called Braxton Hicks contractions. Contractions may last for hours, days, or even weeks before true labor sets in.  Signs of labor can include: abdominal cramps; regular contractions that start at 10 minutes apart and  become stronger and more frequent with time; watery or bloody mucus discharge  that comes from the vagina; increased pelvic pressure and dull back pain; and leaking of amniotic fluid. This information is not intended to replace advice given to you by your health care provider. Make sure you discuss any questions you have with your health care provider. Document Released: 09/15/2001 Document Revised: 02/27/2016 Document Reviewed: 11/22/2012 Elsevier Interactive Patient Education  2017 [employer] DTaP Vaccine (Diphtheria, Tetanus, and Pertussis): What You Need to Know 1. Why get vaccinated? Diphtheria, tetanus, and pertussis are serious diseases caused by bacteria. Diphtheria and pertussis are spread from person to person. Tetanus enters the body through cuts or wounds. DIPHTHERIA causes a thick covering in the back of the throat.  It can lead to breathing problems, paralysis, heart failure, and even death. TETANUS (Lockjaw) causes painful tightening of the muscles, usually all over the body.  It can lead to "locking" of the jaw so the victim cannot open his mouth or swallow. Tetanus leads to death in up to 2 out of 10 cases. PERTUSSIS (Whooping Cough) causes coughing spells so bad that it is hard for infants to eat, drink, or breathe. These spells can last for weeks.  It can lead to pneumonia, seizures (jerking and staring spells), brain damage, and death. Diphtheria, tetanus, and pertussis vaccine (DTaP) can help prevent these diseases. Most children who are vaccinated with DTaP will be protected throughout childhood. Many more children would get these diseases if we stopped vaccinating. DTaP is a safer version of an older vaccine called DTP. DTP is no longer used in the [place]. 2. Who should get DTaP vaccine and when? Children should get 5 doses of DTaP vaccine, one dose at each of the following ages:  2 months  4 months  6 months  15-18 months  4-6 years DTaP may be given at the same time as other vaccines. 3. Some children should not get DTaP  vaccine or should wait  Children with minor illnesses, such as a cold, may be vaccinated. But children who are moderately or severely ill should usually wait until they recover before getting DTaP vaccine.  Any child who had a life-threatening allergic reaction after a dose of DTaP should not get another dose.  Any child who suffered a brain or nervous system disease within 7 days after a dose of DTaP should not get another dose.  Talk with your doctor if your child:  had a seizure or collapsed after a dose of DTaP,  cried non-stop for 3 hours or more after a dose of DTaP,  had a fever over 105F after a dose of DTaP. Ask your doctor for more information. Some of these children should not get another dose of pertussis vaccine, but may get a vaccine without pertussis, called DT. 4. Older children and adults DTaP is not licensed for adolescents, adults, or children 47 years of age and older. But older people still need protection. A vaccine called Tdap is similar to DTaP. A single dose of Tdap is recommended for people 11 through 26 years of age. Another vaccine, called Td, protects against tetanus and diphtheria, but not pertussis. It is recommended every 10 years. There are separate Vaccine Information Statements for these vaccines. 5. What are the risks from DTaP vaccine? Getting diphtheria, tetanus, or pertussis disease is much riskier than getting DTaP vaccine. However, a vaccine, like any medicine, is capable of causing serious problems, such as severe allergic reactions. The risk  of DTaP vaccine causing serious harm, or death, is extremely small. Mild problems (common)   Fever (up to about 1 child in 4)  Redness or swelling where the shot was given (up to about 1 child in 4)  Soreness or tenderness where the shot was given (up to about 1 child in 4) These problems occur more often after the 4th and 5th doses of the DTaP series than after earlier doses. Sometimes the 4th or 5th dose  of DTaP vaccine is followed by swelling of the entire arm or leg in which the shot was given, lasting 1-7 days (up to about 1 child in 30). Other mild problems include:   Fussiness (up to about 1 child in 3)  Tiredness or poor appetite (up to about 1 child in 10)  Vomiting (up to about 1 child in 50) These problems generally occur 1-3 days after the shot. Moderate problems (uncommon)   Seizure (jerking or staring) (about 1 child out of 14,000)  Non-stop crying, for 3 hours or more (up to about 1 child out of 1,000)  High fever, over 105F (about 1 child out of 16,000) Severe problems (very rare)   Serious allergic reaction (less than 1 out of a million doses)  Several other severe problems have been reported after DTaP vaccine. These include:  Long-term seizures, coma, or lowered consciousness  Permanent brain damage. These are so rare it is hard to tell if they are caused by the vaccine. Controlling fever is especially important for children who have had seizures, for any reason. It is also important if another family member has had seizures. You can reduce fever and pain by giving your child an aspirin-free pain reliever when the shot is given, and for the next 24 hours, following the package instructions. 6. What if there is a serious reaction? What should I look for?  Look for anything that concerns you, such as signs of a severe allergic reaction, very high fever, or behavior changes. Signs of a severe allergic reaction can include hives, swelling of the face and throat, difficulty breathing, a fast heartbeat, dizziness, and weakness. These would start a few minutes to a few hours after the vaccination. What should I do?   If you think it is a severe allergic reaction or other emergency that can't wait, call 9-1-1 or get the person to the nearest hospital. Otherwise, call your doctor.  Afterward, the reaction should be reported to the Vaccine Adverse Event Reporting System  (VAERS). Your doctor might file this report, or you can do it yourself through the VAERS web site at www.vaers.LAgents.no, or by calling 1-6516366810.  VAERS is only for reporting reactions. They do not give medical advice. 7. The National Vaccine Injury Compensation Program The [employer] Vaccine Injury Compensation Program (VICP) is a federal program that was created to compensate people who may have been injured by certain vaccines. Persons who believe they may have been injured by a vaccine can learn about the program and about filing a claim by calling 1-769-730-5773 or visiting the VICP website at SpiritualWord.at. 8. How can I learn more?  Ask your doctor.  Call your local or state health department.  Contact the Centers for Disease Control and Prevention (CDC):  Call 726-351-3666 (1-800-CDC-INFO) or  Visit CDC's website at PicCapture.uy CDC DTaP Vaccine (Diphtheria, Tetanus, and Pertussis) VIS (02/18/06) This information is not intended to replace advice given to you by your health care provider. Make sure you discuss any questions you have  with your health care provider. Document Released: 07/19/2006 Document Revised: 06/11/2016 Document Reviewed: 06/11/2016 Elsevier Interactive Patient Education  2017 [employer].

## 2017-01-20 NOTE — Addendum Note (Signed)
Addended by: [place], Diyana Starrett B on: 01/20/2017 10:51 AM   Modules accepted: Orders

## 2017-01-20 NOTE — Progress Notes (Signed)
Gtt today/sharpe pain low abdomen radiates to back/TDAP today

## 2017-01-21 LAB — 28 WEEK RH+PANEL
BASOS: 0 %
Basophils Absolute: 0 10*3/uL (ref 0.0–0.2)
EOS (ABSOLUTE): 0.1 10*3/uL (ref 0.0–0.4)
Eos: 1 %
Gestational Diabetes Screen: 122 mg/dL (ref 65–139)
HIV Screen 4th Generation wRfx: NONREACTIVE
Hematocrit: 34.8 % (ref 34.0–46.6)
Hemoglobin: 12.4 g/dL (ref 11.1–15.9)
IMMATURE GRANULOCYTES: 2 %
Immature Grans (Abs): 0.2 10*3/uL — ABNORMAL HIGH (ref 0.0–0.1)
Lymphocytes Absolute: 1.6 10*3/uL (ref 0.7–3.1)
Lymphs: 19 %
MCH: 30.8 pg (ref 26.6–33.0)
MCHC: 35.6 g/dL (ref 31.5–35.7)
MCV: 87 fL (ref 79–97)
MONOCYTES: 6 %
Monocytes Absolute: 0.5 10*3/uL (ref 0.1–0.9)
NEUTROS PCT: 72 %
Neutrophils Absolute: 6 10*3/uL (ref 1.4–7.0)
Platelets: 200 10*3/uL (ref 150–379)
RBC: 4.02 x10E6/uL (ref 3.77–5.28)
RDW: 14.6 % (ref 12.3–15.4)
RPR Ser Ql: NONREACTIVE
WBC: 8.2 10*3/uL (ref 3.4–10.8)

## 2017-02-04 ENCOUNTER — Encounter: Payer: Self-pay | Admitting: Certified Nurse Midwife

## 2017-02-04 ENCOUNTER — Encounter: Payer: BLUE CROSS/BLUE SHIELD | Admitting: Advanced Practice Midwife

## 2017-02-04 ENCOUNTER — Ambulatory Visit (INDEPENDENT_AMBULATORY_CARE_PROVIDER_SITE_OTHER): Payer: BLUE CROSS/BLUE SHIELD | Admitting: Certified Nurse Midwife

## 2017-02-04 VITALS — BP 94/58 | Wt 158.0 lb

## 2017-02-04 DIAGNOSIS — Z3A32 32 weeks gestation of pregnancy: Secondary | ICD-10-CM

## 2017-02-04 DIAGNOSIS — R1030 Lower abdominal pain, unspecified: Secondary | ICD-10-CM | POA: Diagnosis not present

## 2017-02-04 DIAGNOSIS — Z3483 Encounter for supervision of other normal pregnancy, third trimester: Secondary | ICD-10-CM

## 2017-02-04 LAB — POCT URINALYSIS DIPSTICK
BILIRUBIN UA: NEGATIVE
Blood, UA: NEGATIVE
GLUCOSE UA: NEGATIVE
Ketones, UA: NEGATIVE
LEUKOCYTES UA: NEGATIVE
Nitrite, UA: NEGATIVE
Protein, UA: NEGATIVE
Spec Grav, UA: 1.01 (ref 1.010–1.025)
UROBILINOGEN UA: NEGATIVE U/dL — AB
pH, UA: 7 (ref 5.0–8.0)

## 2017-02-04 NOTE — Progress Notes (Signed)
Pt c/o pain in lower pelvic area that radiates to back, unsure if they are ctx or not.

## 2017-02-17 ENCOUNTER — Encounter: Payer: BLUE CROSS/BLUE SHIELD | Admitting: Advanced Practice Midwife

## 2017-02-17 ENCOUNTER — Other Ambulatory Visit: Payer: BLUE CROSS/BLUE SHIELD

## 2017-02-26 ENCOUNTER — Ambulatory Visit (INDEPENDENT_AMBULATORY_CARE_PROVIDER_SITE_OTHER): Payer: BLUE CROSS/BLUE SHIELD

## 2017-02-26 ENCOUNTER — Other Ambulatory Visit: Payer: Self-pay | Admitting: Obstetrics & Gynecology

## 2017-02-26 ENCOUNTER — Ambulatory Visit (INDEPENDENT_AMBULATORY_CARE_PROVIDER_SITE_OTHER): Payer: BLUE CROSS/BLUE SHIELD | Admitting: Advanced Practice Midwife

## 2017-02-26 VITALS — BP 104/66 | Wt 163.0 lb

## 2017-02-26 DIAGNOSIS — Z3685 Encounter for antenatal screening for Streptococcus B: Secondary | ICD-10-CM | POA: Diagnosis not present

## 2017-02-26 DIAGNOSIS — Z113 Encounter for screening for infections with a predominantly sexual mode of transmission: Secondary | ICD-10-CM

## 2017-02-26 DIAGNOSIS — O26843 Uterine size-date discrepancy, third trimester: Secondary | ICD-10-CM

## 2017-02-26 DIAGNOSIS — Z3A36 36 weeks gestation of pregnancy: Secondary | ICD-10-CM

## 2017-02-26 LAB — OB RESULTS CONSOLE GBS: STREP GROUP B AG: NEGATIVE

## 2017-02-26 NOTE — Progress Notes (Signed)
Discussed results of growth scan. Baby is 5#8oz at 23%. AFI is low/normal at 7.5. Encouraged increased hydration for that and for irregular painful contractions. Good fetal movement. No LOF, VB. GBS/aptima today. Cervix is visually closed to FT.

## 2017-02-26 NOTE — Progress Notes (Signed)
Braxton hicks/fatigue GBS/Aptima

## 2017-02-28 LAB — STREP GP B NAA: Strep Gp B NAA: NEGATIVE

## 2017-02-28 LAB — GC/CHLAMYDIA PROBE AMP
CHLAMYDIA, DNA PROBE: NEGATIVE
Neisseria gonorrhoeae by PCR: NEGATIVE

## 2017-03-03 ENCOUNTER — Ambulatory Visit (INDEPENDENT_AMBULATORY_CARE_PROVIDER_SITE_OTHER): Payer: BLUE CROSS/BLUE SHIELD | Admitting: Certified Nurse Midwife

## 2017-03-03 VITALS — BP 102/62 | Wt 165.0 lb

## 2017-03-03 DIAGNOSIS — Z3403 Encounter for supervision of normal first pregnancy, third trimester: Secondary | ICD-10-CM

## 2017-03-03 DIAGNOSIS — Z3A36 36 weeks gestation of pregnancy: Secondary | ICD-10-CM

## 2017-03-03 NOTE — Progress Notes (Signed)
Irregular contractions. Complains of feeling thirst all the time. Trying to drink more water. Drank 4 bottle today so far Baby active GBS negative Breast (wearing nipple shields a couple hours each day to help with inverted nipples-seems to be working) Vasectomy vs Mirena IUD ROB 1 week

## 2017-03-03 NOTE — Progress Notes (Signed)
Pt reports no problems.   

## 2017-03-10 ENCOUNTER — Ambulatory Visit (INDEPENDENT_AMBULATORY_CARE_PROVIDER_SITE_OTHER): Payer: BLUE CROSS/BLUE SHIELD | Admitting: Advanced Practice Midwife

## 2017-03-10 VITALS — BP 114/70 | Wt 168.0 lb

## 2017-03-10 DIAGNOSIS — Z3A37 37 weeks gestation of pregnancy: Secondary | ICD-10-CM

## 2017-03-10 NOTE — Progress Notes (Signed)
Had some strong contractions on Sunday- several per hour but not since then. Good fetal movement. No LOF, VB. She is doing a better job of staying hydrated.

## 2017-03-10 NOTE — Patient Instructions (Signed)
Vaginal Delivery Vaginal delivery means that you will give birth by pushing your baby out of your birth canal (vagina). A team of health care providers will help you before, during, and after vaginal delivery. Birth experiences are unique for every woman and every pregnancy, and birth experiences vary depending on where you choose to give birth. What should I do to prepare for my baby's birth? Before your baby is born, it is important to talk with your health care provider about:  Your labor and delivery preferences. These may include: ? Medicines that you may be given. ? How you will manage your pain. This might include non-medical pain relief techniques or injectable pain relief such as epidural analgesia. ? How you and your baby will be monitored during labor and delivery. ? Who may be in the labor and delivery room with you. ? Your feelings about surgical delivery of your baby (cesarean delivery, or C-section) if this becomes necessary. ? Your feelings about receiving donated blood through an IV tube (blood transfusion) if this becomes necessary.  Whether you are able: ? To take pictures or videos of the birth. ? To eat during labor and delivery. ? To move around, walk, or change positions during labor and delivery.  What to expect after your baby is born, such as: ? Whether delayed umbilical cord clamping and cutting is offered. ? Who will care for your baby right after birth. ? Medicines or tests that may be recommended for your baby. ? Whether breastfeeding is supported in your hospital or birth center. ? How long you will be in the hospital or birth center.  How any medical conditions you have may affect your baby or your labor and delivery experience.  To prepare for your baby's birth, you should also:  Attend all of your health care visits before delivery (prenatal visits) as recommended by your health care provider. This is important.  Prepare your home for your baby's  arrival. Make sure that you have: ? Diapers. ? Baby clothing. ? Feeding equipment. ? Safe sleeping arrangements for you and your baby.  Install a car seat in your vehicle. Have your car seat checked by a certified car seat installer to make sure that it is installed safely.  Think about who will help you with your new baby at home for at least the first several weeks after delivery.  What can I expect when I arrive at the birth center or hospital? Once you are in labor and have been admitted into the hospital or birth center, your health care provider may:  Review your pregnancy history and any concerns you have.  Insert an IV tube into one of your veins. This is used to give you fluids and medicines.  Check your blood pressure, pulse, temperature, and heart rate (vital signs).  Check whether your bag of water (amniotic sac) has broken (ruptured).  Talk with you about your birth plan and discuss pain control options.  Monitoring Your health care provider may monitor your contractions (uterine monitoring) and your baby's heart rate (fetal monitoring). You may need to be monitored:  Often, but not continuously (intermittently).  All the time or for long periods at a time (continuously). Continuous monitoring may be needed if: ? You are taking certain medicines, such as medicine to relieve pain or make your contractions stronger. ? You have pregnancy or labor complications.  Monitoring may be done by:  Placing a special stethoscope or a handheld monitoring device on your abdomen to   check your baby's heartbeat, and feeling your abdomen for contractions. This method of monitoring does not continuously record your baby's heartbeat or your contractions.  Placing monitors on your abdomen (external monitors) to record your baby's heartbeat and the frequency and length of contractions. You may not have to wear external monitors all the time.  Placing monitors inside of your uterus  (internal monitors) to record your baby's heartbeat and the frequency, length, and strength of your contractions. ? Your health care provider may use internal monitors if he or she needs more information about the strength of your contractions or your baby's heart rate. ? Internal monitors are put in place by passing a thin, flexible wire through your vagina and into your uterus. Depending on the type of monitor, it may remain in your uterus or on your baby's head until birth. ? Your health care provider will discuss the benefits and risks of internal monitoring with you and will ask for your permission before inserting the monitors.  Telemetry. This is a type of continuous monitoring that can be done with external or internal monitors. Instead of having to stay in bed, you are able to move around during telemetry. Ask your health care provider if telemetry is an option for you.  Physical exam Your health care provider may perform a physical exam. This may include:  Checking whether your baby is positioned: ? With the head toward your vagina (head-down). This is most common. ? With the head toward the top of your uterus (head-up or breech). If your baby is in a breech position, your health care provider may try to turn your baby to a head-down position so you can deliver vaginally. If it does not seem that your baby can be born vaginally, your provider may recommend surgery to deliver your baby. In rare cases, you may be able to deliver vaginally if your baby is head-up (breech delivery). ? Lying sideways (transverse). Babies that are lying sideways cannot be delivered vaginally.  Checking your cervix to determine: ? Whether it is thinning out (effacing). ? Whether it is opening up (dilating). ? How low your baby has moved into your birth canal.  What are the three stages of labor and delivery?  Normal labor and delivery is divided into the following three stages: Stage 1  Stage 1 is the  longest stage of labor, and it can last for hours or days. Stage 1 includes: ? Early labor. This is when contractions may be irregular, or regular and mild. Generally, early labor contractions are more than 10 minutes apart. ? Active labor. This is when contractions get longer, more regular, more frequent, and more intense. ? The transition phase. This is when contractions happen very close together, are very intense, and may last longer than during any other part of labor.  Contractions generally feel mild, infrequent, and irregular at first. They get stronger, more frequent (about every 2-3 minutes), and more regular as you progress from early labor through active labor and transition.  Many women progress through stage 1 naturally, but you may need help to continue making progress. If this happens, your health care provider may talk with you about: ? Rupturing your amniotic sac if it has not ruptured yet. ? Giving you medicine to help make your contractions stronger and more frequent.  Stage 1 ends when your cervix is completely dilated to 4 inches (10 cm) and completely effaced. This happens at the end of the transition phase. Stage 2  Once   your cervix is completely effaced and dilated to 4 inches (10 cm), you may start to feel an urge to push. It is common for the body to naturally take a rest before feeling the urge to push, especially if you received an epidural or certain other pain medicines. This rest period may last for up to 1-2 hours, depending on your unique labor experience.  During stage 2, contractions are generally less painful, because pushing helps relieve contraction pain. Instead of contraction pain, you may feel stretching and burning pain, especially when the widest part of your baby's head passes through the vaginal opening (crowning).  Your health care provider will closely monitor your pushing progress and your baby's progress through the vagina during stage 2.  Your  health care provider may massage the area of skin between your vaginal opening and anus (perineum) or apply warm compresses to your perineum. This helps it stretch as the baby's head starts to crown, which can help prevent perineal tearing. ? In some cases, an incision may be made in your perineum (episiotomy) to allow the baby to pass through the vaginal opening. An episiotomy helps to make the opening of the vagina larger to allow more room for the baby to fit through.  It is very important to breathe and focus so your health care provider can control the delivery of your baby's head. Your health care provider may have you decrease the intensity of your pushing, to help prevent perineal tearing.  After delivery of your baby's head, the shoulders and the rest of the body generally deliver very quickly and without difficulty.  Once your baby is delivered, the umbilical cord may be cut right away, or this may be delayed for 1-2 minutes, depending on your baby's health. This may vary among health care providers, hospitals, and birth centers.  If you and your baby are healthy enough, your baby may be placed on your chest or abdomen to help maintain the baby's temperature and to help you bond with each other. Some mothers and babies start breastfeeding at this time. Your health care team will dry your baby and help keep your baby warm during this time.  Your baby may need immediate care if he or she: ? Showed signs of distress during labor. ? Has a medical condition. ? Was born too early (prematurely). ? Had a bowel movement before birth (meconium). ? Shows signs of difficulty transitioning from being inside the uterus to being outside of the uterus. If you are planning to breastfeed, your health care team will help you begin a feeding. Stage 3  The third stage of labor starts immediately after the birth of your baby and ends after you deliver the placenta. The placenta is an organ that develops  during pregnancy to provide oxygen and nutrients to your baby in the womb.  Delivering the placenta may require some pushing, and you may have mild contractions. Breastfeeding can stimulate contractions to help you deliver the placenta.  After the placenta is delivered, your uterus should tighten (contract) and become firm. This helps to stop bleeding in your uterus. To help your uterus contract and to control bleeding, your health care provider may: ? Give you medicine by injection, through an IV tube, by mouth, or through your rectum (rectally). ? Massage your abdomen or perform a vaginal exam to remove any blood clots that are left in your uterus. ? Empty your bladder by placing a thin, flexible tube (catheter) into your bladder. ? Encourage   you to breastfeed your baby. After labor is over, you and your baby will be monitored closely to ensure that you are both healthy until you are ready to go home. Your health care team will teach you how to care for yourself and your baby. This information is not intended to replace advice given to you by your health care provider. Make sure you discuss any questions you have with your health care provider. Document Released: 06/30/2008 Document Revised: 04/10/2016 Document Reviewed: 10/06/2015 Elsevier Interactive Patient Education  2018 Elsevier Inc.  

## 2017-03-12 ENCOUNTER — Encounter: Payer: Self-pay | Admitting: Obstetrics and Gynecology

## 2017-03-15 ENCOUNTER — Encounter: Payer: Self-pay | Admitting: Obstetrics and Gynecology

## 2017-03-15 NOTE — Telephone Encounter (Signed)
OK to write her out of work due to late pregnancy (>36 weeks) pains

## 2017-03-17 ENCOUNTER — Ambulatory Visit (INDEPENDENT_AMBULATORY_CARE_PROVIDER_SITE_OTHER): Payer: BLUE CROSS/BLUE SHIELD | Admitting: Obstetrics and Gynecology

## 2017-03-17 VITALS — BP 118/74 | Wt 167.0 lb

## 2017-03-17 DIAGNOSIS — Z3A38 38 weeks gestation of pregnancy: Secondary | ICD-10-CM

## 2017-03-17 DIAGNOSIS — Z3483 Encounter for supervision of other normal pregnancy, third trimester: Secondary | ICD-10-CM

## 2017-03-17 NOTE — Progress Notes (Signed)
Pt has had on and off contractions, a lot of pressure. No vb. No lof.

## 2017-03-25 ENCOUNTER — Telehealth: Payer: Self-pay

## 2017-03-25 NOTE — Telephone Encounter (Signed)
FMLA/DISABILITY form for [employer] filled out and given to [place] for processing. 

## 2017-03-26 ENCOUNTER — Ambulatory Visit (INDEPENDENT_AMBULATORY_CARE_PROVIDER_SITE_OTHER): Payer: BLUE CROSS/BLUE SHIELD | Admitting: Certified Nurse Midwife

## 2017-03-26 VITALS — BP 110/60 | Wt 169.0 lb

## 2017-03-26 DIAGNOSIS — Z3483 Encounter for supervision of other normal pregnancy, third trimester: Secondary | ICD-10-CM

## 2017-03-26 DIAGNOSIS — Z3A40 40 weeks gestation of pregnancy: Secondary | ICD-10-CM

## 2017-03-26 NOTE — Progress Notes (Signed)
Pt reports intermittent ctx and headache for the past 3 days. Requests cervical check.

## 2017-03-27 ENCOUNTER — Encounter: Payer: Self-pay | Admitting: Certified Nurse Midwife

## 2017-03-27 NOTE — Addendum Note (Signed)
Addended by: Farrel ConnersGUTIERREZ, Jontavia Leatherbury on: 03/27/2017 12:30 PM   Modules accepted: Orders

## 2017-03-27 NOTE — H&P (Signed)
OB History & Physical   History of Present Illness:  Chief Complaint:   HPI:  Vicki Lutz is a 27 y.o. G69P1011 female with EDC=03/26/2017 at 41wk1d dated by LMP=11 week ultrasound.  Her pregnancy has been uncomplicated.  She presents to L&D for induction of labor for postdates   She reports mild irregular contractions. She denies leakage of fluid.   She denies Vaginal Bleeding.   She reports normal fetal movement.   Prenatal care site: Prenatal care at [facility]  Dating LMP=11 week ultrasound  Genetic Screen 1 Screen:    AFP:     Quad:     NIPS:  Anatomic [place] Completed 01/13/17, posterior placenta  GTT Early:               Third trimester: 122  Rhogam   TDaP vaccine           01/20/2017            Flu Shot:  Baby Food               Breast                  Contraception Vasectomy vs Mirena  CBB    CS/VBAC   Support Person             Prenatal Labs  Blood type: O/Positive/-- (11/14 0000)   Antibody:Negative (11/14 0000)  Rubella: Immune (11/14 0000) Varicella: I  RPR: Nonreactive (11/14 0000)   HBsAg: Negative (11/14 0000)   HIV: Non-reactive (11/14 0000)   GBS: negative 5/25  Pap: NIL on 08/18/2016  GC/ Chlamydia: NEG/NEG            Maternal Medical History:   Past Medical History:  Diagnosis Date  . Family history of congenital anomalies    paternal aunt had two children with omphalacele; paternal uncle has child with muscular wasting of lower extremeties  . Family history of congenital heart defect    maternal cousin    Past Surgical History:  Procedure Laterality Date  . NO PAST SURGERIES      No Known Allergies  Prior to Admission medications   Not on File          Social History: She  reports that she has never smoked. She has never used smokeless tobacco. She reports that she does not drink alcohol or use drugs.  Family History: family history includes Diabetes in her paternal aunt and paternal grandmother;  Hyperlipidemia in her father; Hypertension in her maternal grandmother.   Review of Systems: Negative x 10 systems reviewed except as noted in the HPI.      Physical Exam:  Vital Signs: BP 110/60   Wt 169 lb (76.7 kg)   LMP 06/19/2016 (Approximate)   BMI 31.93 kg/m  General: no acute distress.  HEENT: normocephalic, atraumatic Heart: regular rate & rhythm.  No murmurs Lungs: clear to auscultation bilaterally Abdomen: soft, gravid, non-tender;  EFW: 7#10 oz Pelvic:   External: Normal external female genitalia  Cervix: Dilation: Fingertip / Effacement (%): 50 / Station: -1   Extremities: non-tender, symmetric, pedal/ankle edema bilaterally.  DTRs: +2 Neurologic: Alert & oriented x 3.    Pertinent Results:  Prenatal Labs: Blood type/Rh O positive  Antibody screen negative  Rubella immune  RPR Non reactive  HBsAg negative  HIV Non reactive  GC negative  Chlamydia negative  Genetic screening declined  1 hour GTT 122     GBS negative on 02/26/17  Baseline FHR:  Contractions:      Assessment:  Vicki Lutz is a [age] y.o. 513P1011 female at 41wk1d for IOL for postdates  Plan:  1. Admit to Labor & Delivery 2. CBC, T&S, Clrs, IVF 3. GBS negative.   4. Consents obtained. 5. O POS/ RI/ VI/   6. Contraception: Vasectomy vs Mirena 7. Plan Cytotec induction +/- foley bulb. Discussed risks of induction vs risks of continued observation. She is aware of risks of hyperstimulation with Pitocin or Cytotec, risks of fetal intolerance to labor, risks of Cesarean section. Patient wishes to proceed with induction.  Vicki Lutz  03/27/2017 11:49 AM

## 2017-03-27 NOTE — Progress Notes (Signed)
Irregular contractions. Headaches/ muscular pain in neck not relieved with Tylenol. Recommend massage and heat Baby active Cervix: 0.5cm/50-60%/-1/ posterior/ med. Scheduled for  IOL 30 June for postdates. Discussed use of Cytotec, foley bulb, Pitocin. Last pregnancy induced for postdates Labor precautions ROB/ NST in 1 week

## 2017-03-31 NOTE — Progress Notes (Signed)
Complains of lower abdominal pain radiating to back Urine dipstick negative Cervix closed/50%/-2 No VB or LOF Breast/ ?vasectomy 28 week lab results WNL Growth scan and ROB NV

## 2017-04-01 ENCOUNTER — Telehealth: Payer: Self-pay

## 2017-04-01 NOTE — Telephone Encounter (Signed)
FMLA/DISABILITY form filled out for [employer] and given to [place] for processing.

## 2017-04-02 ENCOUNTER — Ambulatory Visit (INDEPENDENT_AMBULATORY_CARE_PROVIDER_SITE_OTHER): Payer: BLUE CROSS/BLUE SHIELD | Admitting: Certified Nurse Midwife

## 2017-04-02 VITALS — BP 108/66 | Wt 167.0 lb

## 2017-04-02 DIAGNOSIS — Z3A41 41 weeks gestation of pregnancy: Secondary | ICD-10-CM

## 2017-04-02 DIAGNOSIS — Z3483 Encounter for supervision of other normal pregnancy, third trimester: Secondary | ICD-10-CM

## 2017-04-02 NOTE — Progress Notes (Signed)
NST today Yellowish/green discharge contractions

## 2017-04-04 ENCOUNTER — Inpatient Hospital Stay: Payer: BLUE CROSS/BLUE SHIELD | Admitting: Anesthesiology

## 2017-04-04 ENCOUNTER — Inpatient Hospital Stay
Admission: EM | Admit: 2017-04-04 | Discharge: 2017-04-06 | DRG: 775 | Disposition: A | Discharge status: Home / Self Care | Payer: BLUE CROSS/BLUE SHIELD | Attending: Obstetrics and Gynecology | Admitting: Obstetrics and Gynecology

## 2017-04-04 ENCOUNTER — Encounter: Payer: Self-pay | Admitting: *Deleted

## 2017-04-04 DIAGNOSIS — O480 Post-term pregnancy: Principal | ICD-10-CM | POA: Diagnosis present

## 2017-04-04 DIAGNOSIS — Z3A41 41 weeks gestation of pregnancy: Secondary | ICD-10-CM

## 2017-04-04 LAB — CBC
HCT: 36.3 % (ref 35.0–47.0)
HEMOGLOBIN: 12.7 g/dL (ref 12.0–16.0)
MCH: 30.1 pg (ref 26.0–34.0)
MCHC: 35 g/dL (ref 32.0–36.0)
MCV: 86.1 fL (ref 80.0–100.0)
PLATELETS: 214 10*3/uL (ref 150–440)
RBC: 4.22 MIL/uL (ref 3.80–5.20)
RDW: 15.3 % — ABNORMAL HIGH (ref 11.5–14.5)
WBC: 8.7 10*3/uL (ref 3.6–11.0)

## 2017-04-04 LAB — TYPE AND SCREEN
ABO/RH(D): O POS
ANTIBODY SCREEN: NEGATIVE

## 2017-04-04 MED ORDER — ACETAMINOPHEN 500 MG PO TABS
1000.0000 mg | ORAL_TABLET | Freq: Four times a day (QID) | ORAL | Status: DC | PRN
  Administered 2017-04-04: 1000 mg via ORAL

## 2017-04-04 MED ORDER — FENTANYL 2.5 MCG/ML W/ROPIVACAINE 0.15% IN NS 100 ML EPIDURAL (ARMC)
EPIDURAL | Status: AC
  Filled 2017-04-04: qty 100

## 2017-04-04 MED ORDER — PHENYLEPHRINE 40 MCG/ML (10ML) SYRINGE FOR IV PUSH (FOR BLOOD PRESSURE SUPPORT)
80.0000 ug | PREFILLED_SYRINGE | INTRAVENOUS | Status: DC | PRN
Start: 2017-04-04 — End: 2017-04-05
  Filled 2017-04-04: qty 5

## 2017-04-04 MED ORDER — OXYTOCIN 10 UNIT/ML IJ SOLN
INTRAMUSCULAR | Status: AC
  Filled 2017-04-04: qty 2

## 2017-04-04 MED ORDER — ACETAMINOPHEN 500 MG PO TABS
ORAL_TABLET | ORAL | Status: AC
  Administered 2017-04-04: 1000 mg via ORAL
  Filled 2017-04-04: qty 2

## 2017-04-04 MED ORDER — FENTANYL 2.5 MCG/ML W/ROPIVACAINE 0.15% IN NS 100 ML EPIDURAL (ARMC)
12.0000 mL/h | EPIDURAL | Status: DC
  Administered 2017-04-04: 12 mL/h via EPIDURAL

## 2017-04-04 MED ORDER — LACTATED RINGERS IV SOLN
INTRAVENOUS | Status: DC
  Administered 2017-04-04 (×3): via INTRAVENOUS

## 2017-04-04 MED ORDER — BUPIVACAINE HCL (PF) 0.25 % IJ SOLN
INTRAMUSCULAR | Status: DC | PRN
  Administered 2017-04-04 (×2): 4 mL via EPIDURAL

## 2017-04-04 MED ORDER — LIDOCAINE HCL (PF) 1 % IJ SOLN
INTRAMUSCULAR | Status: DC | PRN
  Administered 2017-04-04: 4 mL

## 2017-04-04 MED ORDER — EPHEDRINE 5 MG/ML INJ
10.0000 mg | INTRAVENOUS | Status: DC | PRN
  Filled 2017-04-04: qty 2

## 2017-04-04 MED ORDER — LIDOCAINE HCL (PF) 1 % IJ SOLN: 30.0000 mL | INTRAMUSCULAR | Status: DC | PRN

## 2017-04-04 MED ORDER — LIDOCAINE-EPINEPHRINE (PF) 1.5 %-1:200000 IJ SOLN
INTRAMUSCULAR | Status: DC | PRN
  Administered 2017-04-04: 4 mL via EPIDURAL

## 2017-04-04 MED ORDER — LIDOCAINE HCL (PF) 1 % IJ SOLN
INTRAMUSCULAR | Status: AC
  Filled 2017-04-04: qty 30

## 2017-04-04 MED ORDER — LACTATED RINGERS IV SOLN: 500.0000 mL | Freq: Once | INTRAVENOUS | Status: DC

## 2017-04-04 MED ORDER — TERBUTALINE SULFATE 1 MG/ML IJ SOLN: 0.2500 mg | Freq: Once | INTRAMUSCULAR | Status: DC | PRN

## 2017-04-04 MED ORDER — OXYTOCIN 40 UNITS IN LACTATED RINGERS INFUSION - SIMPLE MED
INTRAVENOUS | Status: AC
  Administered 2017-04-04: 1 m[IU]/min via INTRAVENOUS
  Filled 2017-04-04: qty 1000

## 2017-04-04 MED ORDER — OXYTOCIN 40 UNITS IN LACTATED RINGERS INFUSION - SIMPLE MED: 2.5000 [IU]/h | INTRAVENOUS | Status: DC

## 2017-04-04 MED ORDER — MISOPROSTOL 200 MCG PO TABS
ORAL_TABLET | ORAL | Status: AC
  Filled 2017-04-04: qty 4

## 2017-04-04 MED ORDER — PHENYLEPHRINE 40 MCG/ML (10ML) SYRINGE FOR IV PUSH (FOR BLOOD PRESSURE SUPPORT)
80.0000 ug | PREFILLED_SYRINGE | INTRAVENOUS | Status: DC | PRN
  Filled 2017-04-04: qty 5

## 2017-04-04 MED ORDER — ONDANSETRON HCL 4 MG/2ML IJ SOLN: 4.0000 mg | Freq: Four times a day (QID) | INTRAMUSCULAR | Status: DC | PRN

## 2017-04-04 MED ORDER — DIPHENHYDRAMINE HCL 50 MG/ML IJ SOLN: 12.5000 mg | INTRAMUSCULAR | Status: DC | PRN

## 2017-04-04 MED ORDER — OXYTOCIN 40 UNITS IN LACTATED RINGERS INFUSION - SIMPLE MED
1.0000 m[IU]/min | INTRAVENOUS | Status: DC
  Administered 2017-04-04: 1 m[IU]/min via INTRAVENOUS

## 2017-04-04 MED ORDER — OXYTOCIN BOLUS FROM INFUSION
500.0000 mL | Freq: Once | INTRAVENOUS | Status: AC
  Administered 2017-04-04: 500 mL via INTRAVENOUS

## 2017-04-04 MED ORDER — LACTATED RINGERS IV SOLN: 500.0000 mL | INTRAVENOUS | Status: DC | PRN

## 2017-04-04 MED ORDER — AMMONIA AROMATIC IN INHA
RESPIRATORY_TRACT | Status: AC
  Filled 2017-04-04: qty 10

## 2017-04-04 NOTE — Progress Notes (Signed)
Having some irregular contractions and mucoid discharge No vaginal bleeding NST reactive with baseline 135-140 and accelerations to 150-155, moderate variability AFI=6.9cm with pockets measuring 3.12, 1.91, and1.94 cm/ vertex Cervix 1.5/70%/-1 to -2 IOL scheduled for tomorrow AM.

## 2017-04-04 NOTE — Anesthesia Preprocedure Evaluation (Signed)

## 2017-04-04 NOTE — Discharge Summary (Signed)
OB Discharge Summary     Patient Name: Vicki Lutz DOB: 12/13/1990 MRN: [id number]  Date of admission: 04/04/2017 Delivering MD: Thomasene MohairStephen Jackson, MD  Date of Delivery: 04/04/2017  Date of discharge: 04/06/2017  Admitting diagnosis: induction due to postdates Intrauterine pregnancy: 661w2d     Secondary diagnosis: None     Discharge diagnosis: Term Pregnancy Delivered                                                                                                Post partum procedures:none  Augmentation: AROM and Pitocin  Complications: None  Hospital course:  Induction of Labor With Vaginal Delivery   26 y.o. yo G3P1011 at 4061w2d was admitted to the hospital 04/04/2017 for induction of labor.  Indication for induction: Postdates.  Patient had an uncomplicated labor course as follows: Membrane Rupture Time/Date: 6:45 PM ,04/04/2017   Intrapartum Procedures: Episiotomy: None [1]                                         Lacerations:  1st degree [2];Perineal [11]  Patient had delivery of a Viable infant.  Information for the patient's newborn:  Annamaria HellingDepaz, Ciji Baby Girl [[id number]][[id number]]  Delivery Method: Vag-Spont   04/04/2017  Details of delivery can be found in separate delivery note.  Patient had a routine postpartum course. Patient is discharged home 04/06/17.  Physical exam  Vitals:   04/05/17 1125 04/05/17 1631 04/05/17 1944 04/06/17 0736  BP: 105/64 110/60 108/63 (!) 101/52  Pulse: 75 76 90 70  Resp: 18 20 18 14   Temp: 99.3 F (37.4 C) 98.8 F (37.1 C) 98.1 F (36.7 C) 97.9 F (36.6 C)  TempSrc: Oral Oral Oral Oral  SpO2:   97% 100%  Weight:      Height:       General: alert, cooperative and no distress Lochia: appropriate Uterine Fundus: firm Incision: N/A DVT Evaluation: No evidence of DVT seen on physical exam. No cords or calf tenderness. No significant calf/ankle edema.  Prenatal Labs: O positive, Rubella Immune, Varicella Immune, GBS negative  Labs: Lab Results   Component Value Date   WBC 12.0 (H) 04/05/2017   HGB 12.1 04/05/2017   HCT 34.7 (L) 04/05/2017   MCV 87.0 04/05/2017   PLT 198 04/05/2017    Discharge instruction: per After Visit Summary.  Medications:  Allergies as of 04/06/2017   No Known Allergies     Medication List    TAKE these medications   prenatal vitamin w/FE, FA 27-1 MG Tabs tablet Take 1 tablet by mouth daily at 12 noon.       Diet: routine diet  Activity: Advance as tolerated. Pelvic rest for 6 weeks.   Outpatient follow up: Follow-up Information    Conard NovakJackson, Stephen D, MD Follow up in 6 week(s).   Specialty:  Obstetrics and Gynecology Why:  Routine 6 week postpartum checkup and IUD insertion Contact information: [address] [place] [address] [phone]  Postpartum contraception: Mirena IUD Rhogam Given postpartum: no Rubella vaccine given postpartum: no Varicella vaccine given postpartum: no TDaP given antepartum or postpartum: AP, 01/20/2017  Newborn Data: Live born female Eunice Blase")  Birth Weight: 2140 g  APGAR: 8, 9   Baby Feeding: Bottle and Breast  Disposition:home with mother  SIGNED: Tresea Mall, CNM

## 2017-04-04 NOTE — Anesthesia Procedure Notes (Signed)
Epidural Patient location during procedure: OB  Staffing Performed: anesthesiologist   Preanesthetic Checklist Completed: patient identified, site marked, surgical consent, pre-op evaluation, timeout performed, IV checked, risks and benefits discussed and monitors and equipment checked  Epidural Patient position: sitting Prep: Betadine Patient monitoring: heart rate, continuous pulse ox and blood pressure Approach: midline Location: L4-L5 Injection technique: LOR saline  Needle:  Needle type: Tuohy  Needle gauge: 17 G Needle length: 9 cm and 9 Needle insertion depth: 6 cm Catheter type: closed end flexible Catheter size: 19 Gauge Catheter at skin depth: 12 cm Test dose: negative and 1.5% lidocaine with Epi 1:200 K  Assessment Sensory level: T10 Events: blood not aspirated, injection not painful, no injection resistance, negative IV test and no paresthesia  Additional Notes   Patient tolerated the insertion well without complications.-SATD -IVTD. No paresthesia. Refer to [facility] nursing for VS and dosingReason for block:procedure for pain     

## 2017-04-04 NOTE — H&P (Signed)
Patient presents for labor induction. See history and physical written. Updates as below.  Subjective: Occasional contractions, no vaginal bleeding, no leakage of fluid.  + fetal movement.  Objective: BP 110/70 (BP Location: Left Arm)   Pulse 87   Temp 98.1 F (36.7 C) (Oral)   Resp 18   Ht 5\' 1"  (1.549 m)   Wt 167 lb (75.8 kg)   LMP 06/19/2016 (Approximate)   BMI 31.55 kg/m   Gen NAD Cvx: 3/70/-2/posterior Ext: no e/c/t  Baseline FHR: 130 beats/min Variability: moderate Accelerations: present Decelerations: present (one deceleration lasting 4 minutes to 50's with return to basline with moderate variability) Tocometry: minimal activity  Assessment and Plan: 26 y.o. [id number] female at 4513w2d here for induction of labor  Induction: pitocin per protocol.  Discussed risk of c-section with induction and concern for this need especially given fetal heart rate deceleration.  Will proceed cautiously. FWB: reassuring now given recovery from apparent spontaneous deceleration.  Monitor closely. Category 1 right now. GBS negative 5 weeks ago.  Thomasene MohairStephen Oswin Johal, MD 04/04/2017 11:23 AM

## 2017-04-05 LAB — CBC
HCT: 34.7 % — ABNORMAL LOW (ref 35.0–47.0)
Hemoglobin: 12.1 g/dL (ref 12.0–16.0)
MCH: 30.4 pg (ref 26.0–34.0)
MCHC: 34.9 g/dL (ref 32.0–36.0)
MCV: 87 fL (ref 80.0–100.0)
PLATELETS: 198 10*3/uL (ref 150–440)
RBC: 3.98 MIL/uL (ref 3.80–5.20)
RDW: 15.2 % — ABNORMAL HIGH (ref 11.5–14.5)
WBC: 12 10*3/uL — AB (ref 3.6–11.0)

## 2017-04-05 LAB — RPR: RPR Ser Ql: NONREACTIVE

## 2017-04-05 MED ORDER — ONDANSETRON HCL 4 MG/2ML IJ SOLN: 4.0000 mg | Freq: Once | INTRAMUSCULAR | Status: DC | PRN

## 2017-04-05 MED ORDER — SIMETHICONE 80 MG PO CHEW: 80.0000 mg | CHEWABLE_TABLET | ORAL | Status: DC | PRN

## 2017-04-05 MED ORDER — IBUPROFEN 600 MG PO TABS
600.0000 mg | ORAL_TABLET | Freq: Four times a day (QID) | ORAL | Status: DC
  Administered 2017-04-05 – 2017-04-06 (×8): 600 mg via ORAL
  Filled 2017-04-05 (×8): qty 1

## 2017-04-05 MED ORDER — ACETAMINOPHEN 325 MG PO TABS: 650.0000 mg | ORAL_TABLET | ORAL | Status: DC | PRN

## 2017-04-05 MED ORDER — ONDANSETRON HCL 4 MG PO TABS: 4.0000 mg | ORAL_TABLET | ORAL | Status: DC | PRN

## 2017-04-05 MED ORDER — WITCH HAZEL-GLYCERIN EX PADS: 1.0000 "application " | MEDICATED_PAD | CUTANEOUS | Status: DC | PRN

## 2017-04-05 MED ORDER — FENTANYL CITRATE (PF) 100 MCG/2ML IJ SOLN: 25.0000 ug | INTRAMUSCULAR | Status: DC | PRN

## 2017-04-05 MED ORDER — SENNOSIDES-DOCUSATE SODIUM 8.6-50 MG PO TABS
2.0000 | ORAL_TABLET | ORAL | Status: DC
  Administered 2017-04-05 (×2): 2 via ORAL
  Filled 2017-04-05 (×2): qty 2

## 2017-04-05 MED ORDER — FERROUS SULFATE 325 (65 FE) MG PO TABS
325.0000 mg | ORAL_TABLET | Freq: Two times a day (BID) | ORAL | Status: DC
  Administered 2017-04-05 – 2017-04-06 (×4): 325 mg via ORAL
  Filled 2017-04-05 (×4): qty 1

## 2017-04-05 MED ORDER — BENZOCAINE-MENTHOL 20-0.5 % EX AERO: 1.0000 "application " | INHALATION_SPRAY | CUTANEOUS | Status: DC | PRN

## 2017-04-05 MED ORDER — DIBUCAINE 1 % RE OINT: 1.0000 "application " | TOPICAL_OINTMENT | RECTAL | Status: DC | PRN

## 2017-04-05 MED ORDER — ONDANSETRON HCL 4 MG/2ML IJ SOLN: 4.0000 mg | INTRAMUSCULAR | Status: DC | PRN

## 2017-04-05 MED ORDER — COCONUT OIL OIL
1.0000 "application " | TOPICAL_OIL | Status: DC | PRN
  Administered 2017-04-05: 1 via TOPICAL
  Filled 2017-04-05: qty 120

## 2017-04-05 MED ORDER — ACETAMINOPHEN 325 MG PO TABS
650.0000 mg | ORAL_TABLET | ORAL | Status: DC | PRN
  Administered 2017-04-05 (×2): 650 mg via ORAL
  Filled 2017-04-05 (×2): qty 2

## 2017-04-05 MED ORDER — PRENATAL PLUS 27-1 MG PO TABS
1.0000 | ORAL_TABLET | Freq: Every day | ORAL | Status: DC
  Administered 2017-04-05 – 2017-04-06 (×2): 1 via ORAL
  Filled 2017-04-05: qty 1

## 2017-04-05 MED ORDER — DIPHENHYDRAMINE HCL 25 MG PO CAPS
25.0000 mg | ORAL_CAPSULE | Freq: Four times a day (QID) | ORAL | Status: DC | PRN
Start: 2017-04-05 — End: 2017-04-06

## 2017-04-05 NOTE — Anesthesia Postprocedure Evaluation (Signed)
Anesthesia Post Note  Patient: Mickie Runions  Procedure(s) Performed: * No procedures listed *  Patient location during evaluation: Mother Baby Anesthesia Type: Epidural Level of consciousness: awake, awake and alert and oriented Pain management: pain level controlled Vital Signs Assessment: post-procedure vital signs reviewed and stable Respiratory status: spontaneous breathing Cardiovascular status: blood pressure returned to baseline Postop Assessment: no headache, no backache, no signs of nausea or vomiting and adequate PO intake Anesthetic complications: no     Last Vitals:  Vitals:   04/05/17 0050 04/05/17 0346  BP: 108/71 (!) 105/55  Pulse: 78 75  Resp: 18 18  Temp: 37.4 C 36.6 C    Last Pain:  Vitals:   04/05/17 0346  TempSrc: Oral  PainSc:                  Karoline Caldwelleana Merriam Brandner

## 2017-04-05 NOTE — Lactation Note (Signed)
This note was copied from a baby's chart. Lactation Consultation Note Mom reports breast feeding did not work out with last baby.  Pumped for 30 days.  At first mom said she just wanted to pump with this baby.  When took pump into room, mom declines to pump.  When questioned why she continued to give bottles of formula, her reply was that she did not have any milk right now and [name] still acted hungry after BF.  Demonstrated hand expression getting copious amounts of colostrum.  Discussed supply and demand and need to breast feed frequently to bring in mature milk and ensure a plentiful milk supply. Explained normal course of lactation and routine newborn feeding patterns.  Observed breast feeding with [name] latching without difficulty.  Strong rhythmic sucking noted with swallows.  Mom just planning to breast feed.  Lactation name and number written on white board and encouraged to call for questions, concerns or assistance. Patient Name: Vicki Lutz Baby Girl [place] Today's Date: 04/05/2017     Maternal Data    Feeding Feeding Type: Bottle Fed - Formula Nipple Type: Slow - flow  LATCH Score/Interventions                      Lactation Tools Discussed/Used     Consult Status      Louis MeckelWilliams, [name] Kay 04/05/2017, 9:29 PM

## 2017-04-05 NOTE — Progress Notes (Signed)
Post Partum Day 1 Subjective: voiding, tolerating PO and breastfeeding frequently during the night. Tired.  Objective: Blood pressure (!) 105/54, pulse 72, temperature 98.6 F (37 C), temperature source Oral, resp. rate 17, height 5\' 1"  (1.549 m), weight 75.8 kg (167 lb), last menstrual period 06/19/2016, SpO2 100 %, unknown if currently breastfeeding.  Physical Exam:  General: alert, cooperative and no distress Lochia: appropriate Uterine Fundus: firm/ U-2/ ML/ NT  DVT Evaluation: No evidence of DVT seen on physical exam.   Recent Labs  04/04/17 1025 04/05/17 0607  HGB 12.7 12.1  HCT 36.3 34.7*  WBC 8.7 12.0*  PLT 214 198    Assessment/Plan: stable PPD #1 Plan for discharge tomorrow  Continue postpartum care Breast O POS/ RI/ VI/ GBS negative Mirena vs vasesctomy   LOS: 1 day   Farrel ConnersColleen Lawonda Pretlow 04/05/2017, 9:33 AM

## 2017-04-06 MED ORDER — PRENATAL PLUS 27-1 MG PO TABS: 1.0000 | ORAL_TABLET | Freq: Every day | ORAL | 2 refills | Status: DC

## 2017-04-06 NOTE — Discharge Summary (Signed)
Discharge instructions reviewed and signed with pt. Pt verbalizes understanding of all dc instructions. Will remain in room with infant who is still receiving phototherapy.

## 2017-11-28 DIAGNOSIS — Z13220 Encounter for screening for lipoid disorders: Secondary | ICD-10-CM | POA: Diagnosis not present

## 2017-11-28 DIAGNOSIS — Z6825 Body mass index (BMI) 25.0-25.9, adult: Secondary | ICD-10-CM | POA: Diagnosis not present

## 2017-11-28 DIAGNOSIS — Z713 Dietary counseling and surveillance: Secondary | ICD-10-CM | POA: Diagnosis not present

## 2017-11-28 DIAGNOSIS — Z136 Encounter for screening for cardiovascular disorders: Secondary | ICD-10-CM | POA: Diagnosis not present

## 2018-01-17 ENCOUNTER — Encounter: Payer: Self-pay | Admitting: Obstetrics and Gynecology

## 2018-01-17 ENCOUNTER — Ambulatory Visit: Payer: BLUE CROSS/BLUE SHIELD | Admitting: Obstetrics and Gynecology

## 2018-01-17 VITALS — BP 118/78 | Ht 61.0 in | Wt 148.0 lb

## 2018-01-17 DIAGNOSIS — F322 Major depressive disorder, single episode, severe without psychotic features: Secondary | ICD-10-CM | POA: Diagnosis not present

## 2018-01-17 DIAGNOSIS — F329 Major depressive disorder, single episode, unspecified: Secondary | ICD-10-CM | POA: Insufficient documentation

## 2018-01-17 DIAGNOSIS — F32A Depression, unspecified: Secondary | ICD-10-CM | POA: Insufficient documentation

## 2018-01-17 DIAGNOSIS — Z30017 Encounter for initial prescription of implantable subdermal contraceptive: Secondary | ICD-10-CM | POA: Diagnosis not present

## 2018-01-17 MED ORDER — SERTRALINE HCL 50 MG PO TABS: 50.0000 mg | ORAL_TABLET | Freq: Every day | ORAL | 0 refills | Status: DC

## 2018-01-17 NOTE — Progress Notes (Signed)
Obstetrics & Gynecology Office Visit   Chief Complaint  Patient presents with  . Contraception   History of Present Illness: Patient is a 27 y.o. [id number] presenting for contraception consult.  She is currently on no contraception and desiring to start Nexplanon.  She has a past medical history significant for no contraindication to estrogen.  She specifically denies a history of migraine with aura, chronic hypertension, history of DVT/PE and smoking.  Reported Patient's last menstrual period was 01/17/2018.Marland Kitchen    She also presents with recent depression symptoms: she even endorses recent SI.  She has never had a plan.  She states that she has contemplated the thought of how it would be to not have to deal with the daily struggle. She hates feeling this way because overall her husband is very helpful.  The changes that have come about with the baby have been difficult.   Her GAD-7 score: 20 PHQ-9 score: 21 (1 on #10)   Past Medical History:  Diagnosis Date  . Family history of congenital anomalies    paternal aunt had two children with omphalacele; paternal uncle has child with muscular wasting of lower extremeties  . Family history of congenital heart defect    maternal cousin    Past Surgical History:  Procedure Laterality Date  . NO PAST SURGERIES      Gynecologic History: Patient's last menstrual period was 01/17/2018.  Obstetric History: [id number], s/p SVD  Family History  Problem Relation Age of Onset  . Hyperlipidemia Father   . Hypertension Maternal Grandmother   . Diabetes Paternal Grandmother   . Diabetes Paternal Aunt     Social History   Socioeconomic History  . Marital status: Married    Spouse name: Not on file  . Number of children: Not on file  . Years of education: Not on file  . Highest education level: Not on file  Occupational History  . Not on file  Social Needs  . Financial resource strain: Not on file  . Food insecurity:    Worry: Not on file   Inability: Not on file  . Transportation needs:    Medical: Not on file    Non-medical: Not on file  Tobacco Use  . Smoking status: Never Smoker  . Smokeless tobacco: Never Used  Substance and Sexual Activity  . Alcohol use: No  . Drug use: No  . Sexual activity: Yes    Birth control/protection: None  Lifestyle  . Physical activity:    Days per week: Not on file    Minutes per session: Not on file  . Stress: Not on file  Relationships  . Social connections:    Talks on phone: Not on file    Gets together: Not on file    Attends religious service: Not on file    Active member of club or organization: Not on file    Attends meetings of clubs or organizations: Not on file    Relationship status: Not on file  . Intimate partner violence:    Fear of current or ex partner: Not on file    Emotionally abused: Not on file    Physically abused: Not on file    Forced sexual activity: Not on file  Other Topics Concern  . Not on file  Social History Narrative  . Not on file   Allergies: No Known Allergies  Medications: denies    ROS   Physical Exam BP 118/78   Ht 5\' 1"  (1.549 m)  Wt 148 lb (67.1 kg)   LMP 01/17/2018   BMI 27.96 kg/m  Patient's last menstrual period was 01/17/2018. Physical Exam  Constitutional: She appears well-developed and well-nourished. No distress.  HENT:  Head: Normocephalic and atraumatic.  Eyes: Conjunctivae are normal. No scleral icterus.  Neck: No thyromegaly present.  Cardiovascular: Normal rate and regular rhythm. Exam reveals no gallop and no friction rub.  No murmur heard. Pulmonary/Chest: Effort normal and breath sounds normal. No respiratory distress.  Abdominal: Soft. Bowel sounds are normal. She exhibits no distension and no mass. There is no tenderness. There is no rebound and no guarding.  Musculoskeletal: Normal range of motion. She exhibits no edema.  Lymphadenopathy:    She has no cervical adenopathy.  Neurological: She is  alert. No cranial nerve deficit.  Skin: Skin is warm and dry.  Psychiatric: Her speech is normal. Judgment and thought content normal. Her mood appears not anxious. Her affect is blunt. Her affect is not labile. She is slowed. Cognition and memory are normal. She exhibits a depressed mood.  Tearful    GYNECOLOGY PROCEDURE NOTE  Patient is a 27 y.o. [id number] presenting for Nexplanon insertion as her desires means of contraception.  She provided informed consent, signed copy in the chart, time out was performed. Pregnancy test was negative, with self reported LMP of Patient's last menstrual period was 01/17/2018.  She understands that Nexplanon is a progesterone only therapy, and that patients often patients have irregular and unpredictable vaginal bleeding or amenorrhea. She understands that other side effects are possible related to systemic progesterone, including but not limited to, headaches, breast tenderness, nausea, and irritability. While effective at preventing pregnancy long acting reversible contraceptives do not prevent transmission of sexually transmitted diseases and use of barrier methods for this purpose was discussed. The placement procedure for Nexplanon was reviewed with the patient in detail including risks of nerve injury, infection, bleeding and injury to other muscles or tendons. She understands that the Nexplanon implant is good for 3 years and needs to be removed at the end of that time.  She understands that Nexplanon is an extremely effective option for contraception, with failure rate of <1%. This information is reviewed today and all questions were answered. Informed consent was obtained, both verbally and written.   The patient is healthy and has no contraindications to Implanon use. Urine pregnancy test was performed today and was negative.  Procedure Appropriate time out taken.  Patient placed in dorsal supine with left arm above head, elbow flexed at 90 degrees, arm  resting on examination table with hand behind her head.  The bicipital grove was palpated and site 8-10cm proximal to the medial epicondyle was indentified.  Per the manufacturer's recommendations, the insertion site was marked along a line 3-5 cm posterior (toward the triceps) to the bicipital groove and at 8-10 cm medial to the medial epicondyle. The insertion site was prepped with a two betadine swabs and then injected with 2 cc of 1% lidocaine without epinephrine.  Nexplanon removed form sterile blister packaging,  Device confirmed in needle, before inserting full length of needle, tenting up the skin as the needle was advance.  The drug eluting rod was then deployed by pulling back the slider per the manufactures recommendation.  The implant was palpable by the clinician as well as the patient.  The insertion site covered dressed with a 1/2" steri-strip before applying  a kerlex bandage pressure dressing..Minimal blood loss was noted during the procedure.  The patient  tolerated the procedure well.   She was instructed to wear the bandage for 24 hours, call with any signs of infection.  She was given the Implanon card and instructed to have the rod removed in 3 years.  Assessment: 27 y.o. [id number] female here for  1. Current severe episode of major depressive disorder without psychotic features without prior episode (HCC)   2. Encounter for initial prescription of implantable subdermal contraceptive      Plan: Problem List Items Addressed This Visit      Other   Depression - Primary   Relevant Medications   sertraline (ZOLOFT) 50 MG tablet    Other Visit Diagnoses    Encounter for initial prescription of implantable subdermal contraceptive         Nexplanon placed today after discussion of multiple options and counseling regarding the various forms of contraception.  She has tried the IUD in the past and did not want to use that one again.  For her major depression, discussed safety at  length. She is able to contract for safety today. Discussed getting herself somewhere safe if she develops SI with a plan or she feels unsafe.  Specifically, she should go to the ER, should this occur. She voiced understanding and agreement to this recommendation. Discussed treatment with counseling and medication given the severity of her symptoms. She was provided with a list of counselors in the area, which I encouraged her to contact.  SHe was also given a prescription for Zoloft and the side effects were discussed. Specifically, we discussed worsening SI/HI and should that happen, she should contact me immediately and, if after hours, she should go to the ER.  She should follow up in one month to assess effectiveness of therapy.  40 minutes spent in face to face discussion with > 50% spent in counseling,management, and coordination of care of her contraception counseling and her major depressive episode.   Thomasene Mohair, MD 01/17/2018 4:04 PM

## 2018-02-17 ENCOUNTER — Ambulatory Visit: Payer: BLUE CROSS/BLUE SHIELD | Admitting: Obstetrics and Gynecology

## 2018-09-07 ENCOUNTER — Encounter: Payer: Self-pay | Admitting: Obstetrics and Gynecology

## 2018-09-07 ENCOUNTER — Other Ambulatory Visit (INDEPENDENT_AMBULATORY_CARE_PROVIDER_SITE_OTHER): Payer: BLUE CROSS/BLUE SHIELD

## 2018-09-07 ENCOUNTER — Ambulatory Visit (INDEPENDENT_AMBULATORY_CARE_PROVIDER_SITE_OTHER): Payer: BLUE CROSS/BLUE SHIELD | Admitting: Obstetrics and Gynecology

## 2018-09-07 VITALS — BP 120/80 | HR 85 | Ht 61.0 in | Wt 148.0 lb

## 2018-09-07 DIAGNOSIS — N898 Other specified noninflammatory disorders of vagina: Secondary | ICD-10-CM

## 2018-09-07 DIAGNOSIS — N941 Unspecified dyspareunia: Secondary | ICD-10-CM | POA: Diagnosis not present

## 2018-09-07 DIAGNOSIS — R102 Pelvic and perineal pain: Secondary | ICD-10-CM

## 2018-09-07 LAB — POCT WET PREP WITH KOH
Clue Cells Wet Prep HPF POC: NEGATIVE
KOH PREP POC: NEGATIVE
Trichomonas, UA: NEGATIVE
YEAST WET PREP PER HPF POC: NEGATIVE

## 2018-09-07 MED ORDER — FLUCONAZOLE 150 MG PO TABS: 150.0000 mg | ORAL_TABLET | Freq: Once | ORAL | 0 refills | Status: AC

## 2018-09-07 NOTE — Progress Notes (Signed)
[facility], [place]   Chief Complaint  Patient presents with  . Vaginal Discharge    light bleeding in discharge, itchiness, vag area is red and irritated, no odor x 2 weeks ago, has been having pelvic pain for about 2 months now, pain gets worst during intercourse    HPI:      Ms. Vicki Lutz is a 27 y.o. [id number] who LMP was Patient's last menstrual period was 07/18/2018 (approximate)., presents today for increased vaginal d/c, irritation for the past 2 wks. No odor, fevers. No meds to treat. No recent abx use. Uses dove sens skin soap, no dryer sheets.  Has also had constant achy pelvic pain for the past 2 months. Sex increases severity of sx. Sx temporarily improved with NSAIDs, but sx recur. No sx when she first gets up in AM, but goes to sleep with pain. No urin/GI sx. Had LBP last wk that radiated to pelvis for 3-4 days, but that resolved.  Pt has nexplanon with infrequent bleeding. Has increased dysmen when she does have bleeding, improved with NSAIDs. No new sex partners.   Past Medical History:  Diagnosis Date  . Family history of congenital anomalies    paternal aunt had two children with omphalacele; paternal uncle has child with muscular wasting of lower extremeties  . Family history of congenital heart defect    maternal cousin    Past Surgical History:  Procedure Laterality Date  . NO PAST SURGERIES      Family History  Problem Relation Age of Onset  . Hyperlipidemia Father   . Hypertension Maternal Grandmother   . Diabetes Paternal Grandmother   . Diabetes Paternal Aunt     Social History   Socioeconomic History  . Marital status: Married    Spouse name: Not on file  . Number of children: Not on file  . Years of education: Not on file  . Highest education level: Not on file  Occupational History  . Not on file  Social Needs  . Financial resource strain: Not on file  . Food insecurity:    Worry: Not on file    Inability: Not on file  .  Transportation needs:    Medical: Not on file    Non-medical: Not on file  Tobacco Use  . Smoking status: Never Smoker  . Smokeless tobacco: Never Used  Substance and Sexual Activity  . Alcohol use: No  . Drug use: No  . Sexual activity: Yes    Birth control/protection: None, Implant  Lifestyle  . Physical activity:    Days per week: Not on file    Minutes per session: Not on file  . Stress: Not on file  Relationships  . Social connections:    Talks on phone: Not on file    Gets together: Not on file    Attends religious service: Not on file    Active member of club or organization: Not on file    Attends meetings of clubs or organizations: Not on file    Relationship status: Not on file  . Intimate partner violence:    Fear of current or ex partner: Not on file    Emotionally abused: Not on file    Physically abused: Not on file    Forced sexual activity: Not on file  Other Topics Concern  . Not on file  Social History Narrative  . Not on file    Outpatient Medications Prior to Visit  Medication Sig Dispense  Refill  . etonogestrel (NEXPLANON) 68 MG IMPL implant 1 each by Subdermal route once.    . sertraline (ZOLOFT) 50 MG tablet Take 1 tablet (50 mg total) by mouth daily. (Patient not taking: Reported on 09/07/2018) 30 tablet 0  . prenatal vitamin w/FE, FA (PRENATAL 1 + 1) 27-1 MG TABS tablet Take 1 tablet by mouth daily at 12 noon. (Patient not taking: Reported on 01/17/2018) 30 each 2   No facility-administered medications prior to visit.       ROS:  Review of Systems  Constitutional: Negative for fatigue, fever and unexpected weight change.  Respiratory: Negative for cough, shortness of breath and wheezing.   Cardiovascular: Negative for chest pain, palpitations and leg swelling.  Gastrointestinal: Negative for blood in stool, constipation, diarrhea, nausea and vomiting.  Endocrine: Negative for cold intolerance, heat intolerance and polyuria.  Genitourinary:  Positive for dyspareunia, pelvic pain and vaginal discharge. Negative for dysuria, flank pain, frequency, genital sores, hematuria, menstrual problem, urgency, vaginal bleeding and vaginal pain.  Musculoskeletal: Positive for arthralgias. Negative for back pain, joint swelling and myalgias.  Skin: Negative for rash.  Neurological: Positive for headaches. Negative for dizziness, syncope, light-headedness and numbness.  Hematological: Negative for adenopathy.  Psychiatric/Behavioral: Negative for agitation, confusion, sleep disturbance and suicidal ideas. The patient is not nervous/anxious.     OBJECTIVE:   Vitals:  BP 120/80   Pulse 85   Ht 5\' 1"  (1.549 m)   Wt 148 lb (67.1 kg)   LMP 07/18/2018 (Approximate)   Breastfeeding? No   BMI 27.96 kg/m   Physical Exam  Constitutional: She is oriented to person, place, and time. Vital signs are normal. She appears well-developed.  Pulmonary/Chest: Effort normal.  Abdominal: There is tenderness in the right lower quadrant, suprapubic area and left lower quadrant. There is no rigidity and no guarding.  Genitourinary: There is no rash, tenderness or lesion on the right labia. There is no rash, tenderness or lesion on the left labia. Uterus is tender. Uterus is not enlarged. Cervix exhibits motion tenderness. Right adnexum displays tenderness. Right adnexum displays no mass. Left adnexum displays tenderness. Left adnexum displays no mass. No erythema or tenderness in the vagina. Vaginal discharge found.  Genitourinary Comments: D/C EXT, MILD ERYTHEMA  Musculoskeletal: Normal range of motion.  Neurological: She is alert and oriented to person, place, and time.  Psychiatric: She has a normal mood and affect. Her behavior is normal. Thought content normal.  Vitals reviewed.   Results: No results found for this or any previous visit (from the past 24 hour(s)).  Patient Name: Vicki Lutz DOB: 03/26/1991 MRN: [id number] ULTRASOUND REPORT  Location:  [facility]  Date of Service: 09/07/2018    Indications:Pelvic Pain Findings:  The uterus is retroverted and measures 6.3 x 4.7 x 3.9cm. Echo texture is homogenous without evidence of focal masses.  The Endometrium measures 4.0 mm.  Right Ovary measures 4.1 x 2.4 x 1.8 cm. It is normal in appearance. Left Ovary measures 3.0 x 2.0 x 1.7 cm. It is normal in appearance. Survey of the adnexa demonstrates no adnexal masses. There is no free fluid in the cul de sac.  Impression: 1. Normal gyn ultrasound.   Recommendations: 1.Clinical correlation with the patient's History and Physical Exam.  Darlina GuysAbby M Clarke, RDMS RVT  Assessment/Plan: Pelvic pain - Neg GYN u/s, pos exam. Question etiology? Poss MSK given sx worse at end of day. Given sx duration, refer to Dr. Jean RosenthalJackson for further eval/mgmt. - Plan: US PELVIS  TRANSVANGINAL NON-OB (TV ONLY)  Dyspareunia in female  Vaginal discharge - Neg wet prep/pos sx. Treat empirically with Rx diflucan. F/u prn.  - Plan: POCT Wet Prep with KOH, fluconazole (DIFLUCAN) 150 MG tablet    Meds ordered this encounter  Medications  . fluconazole (DIFLUCAN) 150 MG tablet    Sig: Take 1 tablet (150 mg total) by mouth once for 1 dose.    Dispense:  1 tablet    Refill:  0    Order Specific Question:   Supervising Provider    Answer:   Nadara Mustard [[id number]]      Return in about 1 week (around 09/14/2018) for with Dr. Jean Rosenthal for pelvic pain appt.  Vicki Lutz B. Ellison Rieth, PA-C 09/13/2018 3:12 PM

## 2018-09-07 NOTE — Patient Instructions (Signed)
I value your feedback and entrusting us with your care. If you get a [facility] patient survey, I would appreciate you taking the time to let us know about your experience today. Thank you! 

## 2018-11-18 DIAGNOSIS — Z713 Dietary counseling and surveillance: Secondary | ICD-10-CM | POA: Diagnosis not present

## 2018-11-18 DIAGNOSIS — Z13220 Encounter for screening for lipoid disorders: Secondary | ICD-10-CM | POA: Diagnosis not present

## 2018-11-18 DIAGNOSIS — Z136 Encounter for screening for cardiovascular disorders: Secondary | ICD-10-CM | POA: Diagnosis not present

## 2018-12-19 DIAGNOSIS — J309 Allergic rhinitis, unspecified: Secondary | ICD-10-CM | POA: Diagnosis not present

## 2018-12-19 DIAGNOSIS — J00 Acute nasopharyngitis [common cold]: Secondary | ICD-10-CM | POA: Diagnosis not present

## 2019-06-21 ENCOUNTER — Other Ambulatory Visit: Payer: Self-pay

## 2019-06-21 DIAGNOSIS — Z20822 Contact with and (suspected) exposure to covid-19: Secondary | ICD-10-CM

## 2019-06-21 DIAGNOSIS — R6889 Other general symptoms and signs: Secondary | ICD-10-CM | POA: Diagnosis not present

## 2019-06-22 LAB — SPECIMEN STATUS REPORT

## 2019-06-22 LAB — NOVEL CORONAVIRUS, NAA: SARS-CoV-2, NAA: NOT DETECTED

## 2020-05-08 ENCOUNTER — Other Ambulatory Visit (HOSPITAL_COMMUNITY)
Admission: RE | Admit: 2020-05-08 | Discharge: 2020-05-08 | Disposition: A | Discharge status: Home / Self Care | Payer: BC Managed Care – PPO | Source: Ambulatory Visit | Attending: Obstetrics and Gynecology | Admitting: Obstetrics and Gynecology

## 2020-05-08 ENCOUNTER — Ambulatory Visit (INDEPENDENT_AMBULATORY_CARE_PROVIDER_SITE_OTHER): Payer: BC Managed Care – PPO | Admitting: Obstetrics and Gynecology

## 2020-05-08 ENCOUNTER — Other Ambulatory Visit: Payer: Self-pay

## 2020-05-08 ENCOUNTER — Encounter: Payer: Self-pay | Admitting: Obstetrics and Gynecology

## 2020-05-08 VITALS — BP 122/74 | Ht 61.0 in | Wt 156.0 lb

## 2020-05-08 DIAGNOSIS — Z1331 Encounter for screening for depression: Secondary | ICD-10-CM | POA: Diagnosis not present

## 2020-05-08 DIAGNOSIS — Z124 Encounter for screening for malignant neoplasm of cervix: Secondary | ICD-10-CM | POA: Insufficient documentation

## 2020-05-08 DIAGNOSIS — Z01419 Encounter for gynecological examination (general) (routine) without abnormal findings: Secondary | ICD-10-CM | POA: Insufficient documentation

## 2020-05-08 DIAGNOSIS — Z1339 Encounter for screening examination for other mental health and behavioral disorders: Secondary | ICD-10-CM

## 2020-05-08 DIAGNOSIS — Z113 Encounter for screening for infections with a predominantly sexual mode of transmission: Secondary | ICD-10-CM | POA: Diagnosis not present

## 2020-05-08 NOTE — Progress Notes (Signed)
Gynecology Annual Exam  PCP: [facility], [place]  Chief Complaint  Patient presents with  . Annual Exam   History of Present Illness:  Ms. Vicki Lutz is a 29 y.o. [id number] who LMP was No LMP recorded. Patient has had an implant., presents today for her annual examination.  Her menses are more like spotting that lasts 1-1.5 weeks.  This occurs nearly monthly.  She states she is pleased overall.   She is sexually active. She uses Nexplanon for birth control. This was placed on 01/17/2018. Last Pap: ?3 years ago  Results were: no abnormalities /neg HPV DNA not done Hx of STDs: none  There is no FH of breast cancer. There is no FH of ovarian cancer. The patient does do self-breast exams.  Tobacco use: The patient denies current or previous tobacco use. Alcohol use: social drinker Exercise: not really to sometimes  The patient wears seatbelts: yes.   The patient reports that domestic violence in her life is absent.   She scored a 15 on her PHQ9. She has a mild amount of depression that comes and goes.  She denies SI/HI.  She feels mentally drained, which leads to feeling physically drained.  She feels like most days this is under control.  The symptoms come in waves. She feels like she's at a point where she doesn't need any medication.     Past Medical History:  Diagnosis Date  . Family history of congenital anomalies    paternal aunt had two children with omphalacele; paternal uncle has child with muscular wasting of lower extremeties  . Family history of congenital heart defect    maternal cousin    Past Surgical History:  Procedure Laterality Date  . NO PAST SURGERIES      Prior to Admission medications   Medication Sig Start Date End Date Taking? Authorizing Provider  etonogestrel (NEXPLANON) 68 MG IMPL implant 1 each by Subdermal route once. 01/17/18 01/17/21 Yes [provider]   Allergies:  No Known Allergies   Obstetric History: [id number]  Social History    Socioeconomic History  . Marital status: Married    Spouse name: Not on file  . Number of children: Not on file  . Years of education: Not on file  . Highest education level: Not on file  Occupational History  . Not on file  Tobacco Use  . Smoking status: Never Smoker  . Smokeless tobacco: Never Used  Vaping Use  . Vaping Use: Never used  Substance and Sexual Activity  . Alcohol use: No  . Drug use: No  . Sexual activity: Yes    Birth control/protection: Implant  Other Topics Concern  . Not on file  Social History Narrative  . Not on file   Social Determinants of Health   Financial Resource Strain:   . Difficulty of Paying Living Expenses:   Food Insecurity:   . Worried About Programme researcher, broadcasting/film/video in the Last Year:   . Barista in the Last Year:   Transportation Needs:   . Freight forwarder (Medical):   Marland Kitchen Lack of Transportation (Non-Medical):   Physical Activity:   . Days of Exercise per Week:   . Minutes of Exercise per Session:   Stress:   . Feeling of Stress :   Social Connections:   . Frequency of Communication with Friends and Family:   . Frequency of Social Gatherings with Friends and Family:   . Attends Religious Services:   .  Active Member of Clubs or Organizations:   . Attends Banker Meetings:   Marland Kitchen Marital Status:   Intimate Partner Violence:   . Fear of Current or Ex-Partner:   . Emotionally Abused:   Marland Kitchen Physically Abused:   . Sexually Abused:     Family History  Problem Relation Age of Onset  . Hyperlipidemia Father   . Hypertension Maternal Grandmother   . Diabetes Paternal Grandmother   . Diabetes Paternal Aunt     Review of Systems  Constitutional: Negative.   HENT: Negative.   Eyes: Negative.   Respiratory: Negative.   Cardiovascular: Negative.   Gastrointestinal: Negative.   Genitourinary: Negative.   Musculoskeletal: Negative.   Skin: Negative.   Neurological: Negative.   Psychiatric/Behavioral:  Negative.      Physical Exam BP 122/74   Ht 5\' 1"  (1.549 m)   Wt 156 lb (70.8 kg)   BMI 29.48 kg/m    Physical Exam Constitutional:      General: She is not in acute distress.    Appearance: Normal appearance. She is well-developed.  Genitourinary:     Pelvic exam was performed with patient in the lithotomy position.     Vulva, urethra, bladder and uterus normal.     No inguinal adenopathy present in the right or left side.    No signs of injury in the vagina.     No vaginal discharge, erythema, tenderness or bleeding.     No cervical motion tenderness, discharge, lesion or polyp.     Uterus is mobile.     Uterus is not enlarged or tender.     No uterine mass detected.    Uterus is anteverted.     No right or left adnexal mass present.     Right adnexa not tender or full.     Left adnexa not tender or full.  HENT:     Head: Normocephalic and atraumatic.  Eyes:     General: No scleral icterus.    Conjunctiva/sclera: Conjunctivae normal.  Neck:     Thyroid: No thyromegaly.  Cardiovascular:     Rate and Rhythm: Normal rate and regular rhythm.     Heart sounds: No murmur heard.  No friction rub. No gallop.   Pulmonary:     Effort: Pulmonary effort is normal. No respiratory distress.     Breath sounds: Normal breath sounds. No wheezing or rales.  Chest:     Breasts:        Right: No inverted nipple, mass, nipple discharge, skin change or tenderness.        Left: No inverted nipple, mass, nipple discharge, skin change or tenderness.  Abdominal:     General: Bowel sounds are normal. There is no distension.     Palpations: Abdomen is soft. There is no mass.     Tenderness: There is no abdominal tenderness. There is no guarding or rebound.  Musculoskeletal:        General: No swelling or tenderness. Normal range of motion.     Cervical back: Normal range of motion and neck supple.  Lymphadenopathy:     Cervical: No cervical adenopathy.     Lower Body: No right inguinal  adenopathy. No left inguinal adenopathy.  Neurological:     General: No focal deficit present.     Mental Status: She is alert and oriented to person, place, and time.     Cranial Nerves: No cranial nerve deficit.  Skin:    General: Skin  is warm and dry.     Findings: No erythema or rash.  Psychiatric:        Mood and Affect: Mood normal.        Behavior: Behavior normal.        Judgment: Judgment normal.     Female chaperone present for pelvic and breast  portions of the physical exam  Results: AUDIT Questionnaire (screen for alcoholism): 3 PHQ-9: 15   Assessment: 29 y.o. [id number] female here for routine annual gynecologic examination  Plan: Problem List Items Addressed This Visit    None    Visit Diagnoses    Women's annual routine gynecological examination    -  Primary   Relevant Orders   Cytology - PAP   Screening for depression       Screening for alcoholism       Pap smear for cervical cancer screening       Relevant Orders   Cytology - PAP   Screen for STD (sexually transmitted disease)       Relevant Orders   Cytology - PAP      Screening: -- Blood pressure screen normal -- Weight screening: normal -- Depression screening negative (PHQ-9) -- Nutrition: normal -- cholesterol screening: not due for screening -- osteoporosis screening: not due -- tobacco screening: not using -- alcohol screening: AUDIT questionnaire indicates low-risk usage. -- family history of breast cancer screening: done. not at high risk. -- no evidence of domestic violence or intimate partner violence. -- STD screening: gonorrhea/chlamydia NAAT collected -- pap smear collected per ASCCP guidelines  Return in about 1 year (around 05/08/2021) for Annual Gynecologic Examination (Nexplanon removal 01/2021).   Thomasene Mohair, MD 05/08/2020 9:05 AM

## 2020-05-13 LAB — CYTOLOGY - PAP
Chlamydia: NEGATIVE
Comment: NEGATIVE
Comment: NEGATIVE
Comment: NORMAL
Diagnosis: NEGATIVE
Neisseria Gonorrhea: NEGATIVE
Trichomonas: NEGATIVE

## 2020-05-27 DIAGNOSIS — R439 Unspecified disturbances of smell and taste: Secondary | ICD-10-CM | POA: Diagnosis not present

## 2020-05-27 DIAGNOSIS — Z20822 Contact with and (suspected) exposure to covid-19: Secondary | ICD-10-CM | POA: Diagnosis not present

## 2020-10-02 DIAGNOSIS — Z20822 Contact with and (suspected) exposure to covid-19: Secondary | ICD-10-CM | POA: Diagnosis not present

## 2020-10-02 DIAGNOSIS — Z03818 Encounter for observation for suspected exposure to other biological agents ruled out: Secondary | ICD-10-CM | POA: Diagnosis not present

## 2020-10-02 DIAGNOSIS — U071 COVID-19: Secondary | ICD-10-CM | POA: Diagnosis not present

## 2020-10-29 ENCOUNTER — Ambulatory Visit: Payer: BC Managed Care – PPO | Admitting: Internal Medicine

## 2020-10-29 ENCOUNTER — Other Ambulatory Visit: Payer: Self-pay

## 2020-10-29 ENCOUNTER — Encounter: Payer: Self-pay | Admitting: Internal Medicine

## 2020-10-29 DIAGNOSIS — U071 COVID-19: Secondary | ICD-10-CM

## 2020-10-29 DIAGNOSIS — Z20822 Contact with and (suspected) exposure to covid-19: Secondary | ICD-10-CM

## 2020-10-29 DIAGNOSIS — R079 Chest pain, unspecified: Secondary | ICD-10-CM | POA: Diagnosis not present

## 2020-10-29 LAB — POC COVID19 BINAXNOW: SARS Coronavirus 2 Ag: POSITIVE — AB

## 2020-10-30 DIAGNOSIS — R0600 Dyspnea, unspecified: Secondary | ICD-10-CM | POA: Diagnosis not present

## 2020-10-30 DIAGNOSIS — U071 COVID-19: Secondary | ICD-10-CM | POA: Diagnosis not present

## 2020-10-30 DIAGNOSIS — R0789 Other chest pain: Secondary | ICD-10-CM | POA: Diagnosis not present

## 2020-10-30 DIAGNOSIS — R079 Chest pain, unspecified: Secondary | ICD-10-CM | POA: Diagnosis not present

## 2020-10-31 ENCOUNTER — Encounter: Payer: Self-pay | Admitting: Internal Medicine

## 2020-10-31 DIAGNOSIS — R0600 Dyspnea, unspecified: Secondary | ICD-10-CM | POA: Diagnosis not present

## 2020-10-31 NOTE — Progress Notes (Signed)
Established Patient Office Visit  Subjective:  Patient ID: Vicki Lutz, female    DOB: 11-05-1990  Age: 30 y.o. MRN: [id number]  CC:  Chief Complaint  Patient presents with  . New Patient (Initial Visit)    Patient is here as a new patient to establish care with a new pcp    HPI  Vicki Lutz presents for general checkup.   Past Medical History:  Diagnosis Date  . Family history of congenital anomalies    paternal aunt had two children with omphalacele; paternal uncle has child with muscular wasting of lower extremeties  . Family history of congenital heart defect    maternal cousin    Past Surgical History:  Procedure Laterality Date  . NO PAST SURGERIES      Family History  Problem Relation Age of Onset  . Hyperlipidemia Father   . Hypertension Maternal Grandmother   . Diabetes Paternal Grandmother   . Diabetes Paternal Aunt   . Cancer Mother   . Cancer Brother     Social History   Socioeconomic History  . Marital status: Married    Spouse name: Not on file  . Number of children: Not on file  . Years of education: Not on file  . Highest education level: Not on file  Occupational History  . Not on file  Tobacco Use  . Smoking status: Never Smoker  . Smokeless tobacco: Never Used  Vaping Use  . Vaping Use: Never used  Substance and Sexual Activity  . Alcohol use: No  . Drug use: No  . Sexual activity: Yes    Birth control/protection: Implant  Other Topics Concern  . Not on file  Social History Narrative  . Not on file   Social Determinants of Health   Financial Resource Strain: Not on file  Food Insecurity: Not on file  Transportation Needs: Not on file  Physical Activity: Not on file  Stress: Not on file  Social Connections: Not on file  Intimate Partner Violence: Not on file     Current Outpatient Medications:  .  etonogestrel (NEXPLANON) 79 MG IMPL implant, 1 each by Subdermal route once., Disp: , Rfl:    No Known  Allergies  ROS Review of Systems  Constitutional: Negative.   HENT: Negative.   Eyes: Negative.   Respiratory: Positive for chest tightness.   Cardiovascular: Negative.   Gastrointestinal: Negative.   Endocrine: Negative.   Genitourinary: Negative.   Musculoskeletal: Negative.   Skin: Negative.   Allergic/Immunologic: Negative.   Neurological: Negative.   Hematological: Negative.   Psychiatric/Behavioral: Negative.   All other systems reviewed and are negative.     Objective:    Physical Exam Vitals reviewed.  Constitutional:      Appearance: Normal appearance.  HENT:     Mouth/Throat:     Mouth: Mucous membranes are moist.  Eyes:     Pupils: Pupils are equal, round, and reactive to light.  Neck:     Vascular: No carotid bruit.  Cardiovascular:     Rate and Rhythm: Normal rate and regular rhythm.     Pulses: Normal pulses.     Heart sounds: Normal heart sounds.  Pulmonary:     Effort: Pulmonary effort is normal.     Breath sounds: Normal breath sounds.  Abdominal:     General: Bowel sounds are normal.     Palpations: Abdomen is soft. There is no hepatomegaly, splenomegaly or mass.     Tenderness: There is no abdominal  tenderness.     Hernia: No hernia is present.  Musculoskeletal:        General: No tenderness.     Cervical back: Neck supple.     Right lower leg: No edema.     Left lower leg: No edema.  Skin:    Findings: No rash.  Neurological:     Mental Status: She is alert and oriented to Lutz, place, and time.     Motor: No weakness.  Psychiatric:        Mood and Affect: Mood and affect normal.        Behavior: Behavior normal.     There were no vitals taken for this visit. Wt Readings from Last 3 Encounters:  05/08/20 156 lb (70.8 kg)  09/07/18 148 lb (67.1 kg)  01/17/18 148 lb (67.1 kg)     Health Maintenance Due  Topic Date Due  . Hepatitis C Screening  Never done  . INFLUENZA VACCINE  05/05/2020    There are no preventive care  reminders to display for this patient.  No results found for: TSH Lab Results  Component Value Date   WBC 12.0 (H) 04/05/2017   HGB 12.1 04/05/2017   HCT 34.7 (L) 04/05/2017   MCV 87.0 04/05/2017   PLT 198 04/05/2017   No results found for: NA, K, CHLORIDE, CO2, GLUCOSE, BUN, CREATININE, BILITOT, ALKPHOS, AST, ALT, PROT, ALBUMIN, CALCIUM, ANIONGAP, EGFR, GFR Lab Results  Component Value Date   CHOL 202 (A) 11/26/2016   Lab Results  Component Value Date   HDL 79 (A) 11/26/2016   Lab Results  Component Value Date   LDLCALC 89 11/26/2016   Lab Results  Component Value Date   TRIG 169 (A) 11/26/2016   No results found for: CHOLHDL No results found for: HGBA1C    Assessment & Plan:   Problem List Items Addressed This Visit   None   Visit Diagnoses    Chest pain, unspecified type    -  Primary   Relevant Orders   EKG 12-Lead     Coronavirus (COVID-19) Are you at risk?  Are you at risk for the Coronavirus (COVID-19)?  To be considered HIGH RISK for Coronavirus (COVID-19), you have to meet the following criteria:  . Traveled to [place], [place], [place], [place] or [place]; or in the [place] to [place], [place], [place], or [place]; and have fever, cough, and shortness of breath within the last 2 weeks of travel OR . Been in close contact with a Lutz diagnosed with COVID-19 within the last 2 weeks and have fever, cough, and shortness of breath . IF YOU DO NOT MEET THESE CRITERIA, YOU ARE CONSIDERED LOW RISK FOR COVID-19.  What to do if you are HIGH RISK for COVID-19?  . If you are having a medical emergency, call 911. . Seek medical care right away. Before you go to a doctor's office, urgent care or emergency department, call ahead and tell them about your recent travel, contact with someone diagnosed with COVID-19, and your symptoms. You should receive instructions from your physician's office regarding next steps of care.  . When you arrive at  healthcare provider, tell the healthcare staff immediately you have returned from visiting [place], [place], [place], [place] or [place]; or traveled in the [place] to [place], [place], [place], or [place]; in the last two weeks or you have been in close contact with a Lutz diagnosed with COVID-19 in the last   2 weeks.   . Tell the health care staff about your symptoms: fever, cough and shortness of breath. . After you have been seen by a medical provider, you will be either: o Tested for (COVID-19) and discharged home on quarantine except to seek medical care if symptoms worsen, and asked to  - Stay home and avoid contact with others until you get your results (4-5 days)  - Avoid travel on public transportation if possible (such as bus, train, or airplane) or o Sent to the Emergency Department by EMS for evaluation, COVID-19 testing, and possible admission depending on your condition and test results.  What to do if you are LOW RISK for COVID-19?  Reduce your risk of any infection by using the same precautions used for avoiding the common cold or flu:  . Wash your hands often with soap and warm water for at least 20 seconds.  If soap and water are not readily available, use an alcohol-based hand sanitizer with at least 60% alcohol.  . If coughing or sneezing, cover your mouth and nose by coughing or sneezing into the elbow areas of your shirt or coat, into a tissue or into your sleeve (not your hands). . Avoid shaking hands with others and consider head nods or verbal greetings only. . Avoid touching your eyes, nose, or mouth with unwashed hands.  . Avoid close contact with people who are sick. . Avoid places or events with large numbers of people in one location, like concerts or sporting events. . Carefully consider travel plans you have or are making. . If you are planning any travel outside or inside the [place], visit the CDC's Travelers' Health webpage for the latest health  notices. . If you have some symptoms but not all symptoms, continue to monitor at home and seek medical attention if your symptoms worsen. . If you are having a medical emergency, call 911.   ADDITIONAL HEALTHCARE OPTIONS FOR PATIENTS  [facility] / e-Visit: [email or web address]         [facility]: [phone]  [facility]: [phone]                   [facility]: [phone]   No orders of the defined types were placed in this encounter.  Report electrocardiogram.  1.  Normal sinus rhythm #2 no acute changes/ 3- no PVCs or PACs Follow-up: No follow-ups on file.    Javed Masoud, MD 

## 2020-11-04 NOTE — Addendum Note (Signed)
Addended by: Garnet Sierras R on: 11/04/2020 10:25 AM   Modules accepted: Orders

## 2020-11-11 DIAGNOSIS — Z03818 Encounter for observation for suspected exposure to other biological agents ruled out: Secondary | ICD-10-CM | POA: Diagnosis not present

## 2020-11-11 DIAGNOSIS — Z20822 Contact with and (suspected) exposure to covid-19: Secondary | ICD-10-CM | POA: Diagnosis not present

## 2021-05-26 ENCOUNTER — Ambulatory Visit (INDEPENDENT_AMBULATORY_CARE_PROVIDER_SITE_OTHER): Payer: BC Managed Care – PPO | Admitting: Obstetrics and Gynecology

## 2021-05-26 ENCOUNTER — Other Ambulatory Visit: Payer: Self-pay

## 2021-05-26 ENCOUNTER — Encounter: Payer: Self-pay | Admitting: Obstetrics and Gynecology

## 2021-05-26 VITALS — BP 126/84 | Ht 61.0 in | Wt 159.0 lb

## 2021-05-26 DIAGNOSIS — Z01419 Encounter for gynecological examination (general) (routine) without abnormal findings: Secondary | ICD-10-CM | POA: Diagnosis not present

## 2021-05-26 DIAGNOSIS — Z1339 Encounter for screening examination for other mental health and behavioral disorders: Secondary | ICD-10-CM

## 2021-05-26 DIAGNOSIS — Z1331 Encounter for screening for depression: Secondary | ICD-10-CM

## 2021-05-26 NOTE — Progress Notes (Signed)
Gynecology Annual Exam  PCP: [facility], [place]  Chief Complaint  Patient presents with   Annual Exam    History of Present Illness:  Ms. Vicki Lutz is a 30 y.o. [id number] who LMP was Patient's last menstrual period was 04/13/2021 (exact date)., presents today for her annual examination.  Her menses are a bit irregular.  She does not have a period every month. She still has the Nexplanon in place.  This was placed in 01/2018.  She would like to get pregnant next year and have the Nexplanon removed at that time. She understands that there is probably a higher risk of pregnancy if the device is expired.   She is sexually active. She does have some pain with intercourse.  She thinks it has to do with a laceration from delivery.   Last Pap: 05/2020  Results were: no abnormalities /neg HPV DNA not done. Hx of STDs: none  There is no FH of breast cancer. There is no FH of ovarian cancer. The patient does do self-breast exams.  Tobacco use: The patient denies current or previous tobacco use. Alcohol use: social drinker Exercise: no  She has been having been having vulvar itching for the past month. She has noted no change in soaps or detergents.  She has not noted any spots or bumps. She has noted some redness of the skin when she is really itching.  She has tried something like an antifungal (monistat).  She has also tried not wearing underwear.  The itch is not constant and comes and goes.  She does not associate it with any activity. The itch is not bothering her today.   The patient wears seatbelts: yes.   The patient reports that domestic violence in her life is absent.   Past Medical History:  Diagnosis Date   Family history of congenital anomalies    paternal aunt had two children with omphalacele; paternal uncle has child with muscular wasting of lower extremeties   Family history of congenital heart defect    maternal cousin    Past Surgical History:  Procedure Laterality  Date   NO PAST SURGERIES     Prior to Admission medications   Medication Sig Start Date End Date Taking? Authorizing Provider  etonogestrel (NEXPLANON) 68 MG IMPL implant 1 each by Subdermal route once. 01/17/18 01/17/21  [provider]    No Known Allergies  Obstetric History: [id number]  Social History   Socioeconomic History   Marital status: Married    Spouse name: Not on file   Number of children: Not on file   Years of education: Not on file   Highest education level: Not on file  Occupational History   Not on file  Tobacco Use   Smoking status: Never   Smokeless tobacco: Never  Vaping Use   Vaping Use: Never used  Substance and Sexual Activity   Alcohol use: No   Drug use: No   Sexual activity: Yes    Birth control/protection: Implant  Other Topics Concern   Not on file  Social History Narrative   Not on file   Social Determinants of Health   Financial Resource Strain: Not on file  Food Insecurity: Not on file  Transportation Needs: Not on file  Physical Activity: Not on file  Stress: Not on file  Social Connections: Not on file  Intimate Partner Violence: Not on file    Family History  Problem Relation Age of Onset   Hyperlipidemia Father  Hypertension Maternal Grandmother    Diabetes Paternal Grandmother    Diabetes Paternal Aunt    Cancer Mother    Cancer Brother     Review of Systems  Constitutional:  Positive for malaise/fatigue. Negative for chills, diaphoresis, fever and weight loss.  HENT:  Positive for congestion. Negative for ear discharge, ear pain, hearing loss, nosebleeds, sinus pain, sore throat and tinnitus.   Eyes: Negative.   Respiratory: Negative.  Negative for stridor.   Cardiovascular: Negative.   Gastrointestinal: Negative.   Genitourinary: Negative.        See HPI  Musculoskeletal:  Positive for joint pain. Negative for back pain, falls, myalgias and neck pain.  Skin: Negative.   Neurological:  Positive for  headaches. Negative for dizziness, tingling, tremors, sensory change, speech change, focal weakness, seizures, loss of consciousness and weakness.  Endo/Heme/Allergies:  Positive for environmental allergies. Negative for polydipsia. Does not bruise/bleed easily.  Psychiatric/Behavioral: Negative.      Physical Exam BP 126/84   Ht 5\' 1"  (1.549 m)   Wt 159 lb (72.1 kg)   LMP 04/13/2021 (Exact Date)   BMI 30.04 kg/m    Physical Exam Constitutional:      General: She is not in acute distress.    Appearance: Normal appearance. She is well-developed.  Genitourinary:     Vulva and bladder normal.     Right Labia: No rash, tenderness, lesions, skin changes or Bartholin's cyst.    Left Labia: No tenderness, lesions, skin changes, Bartholin's cyst or rash.    Vulva exam comments: No evidence of inflammation, irritation, erosion, lesions.     No inguinal adenopathy present in the right or left side.    Pelvic Tanner Score: 5/5.    No vaginal discharge, erythema, tenderness or bleeding.      Right Adnexa: not tender, not full and no mass present.    Left Adnexa: not tender, not full and no mass present.    No cervical motion tenderness, discharge, lesion or polyp.     Uterus is not enlarged or tender.     No uterine mass detected.    Pelvic exam was performed with patient in the lithotomy position.  Breasts:    Right: No inverted nipple, mass, nipple discharge, skin change or tenderness.     Left: No inverted nipple, mass, nipple discharge, skin change or tenderness.  HENT:     Head: Normocephalic and atraumatic.  Eyes:     General: No scleral icterus.    Conjunctiva/sclera: Conjunctivae normal.  Neck:     Thyroid: No thyromegaly.  Cardiovascular:     Rate and Rhythm: Normal rate and regular rhythm.     Heart sounds: No murmur heard.   No friction rub. No gallop.  Pulmonary:     Effort: Pulmonary effort is normal. No respiratory distress.     Breath sounds: Normal breath sounds. No  wheezing or rales.  Abdominal:     General: Bowel sounds are normal. There is no distension.     Palpations: Abdomen is soft. There is no mass.     Tenderness: There is no abdominal tenderness. There is no guarding or rebound.     Hernia: There is no hernia in the left inguinal area or right inguinal area.  Musculoskeletal:        General: No swelling or tenderness. Normal range of motion.     Cervical back: Normal range of motion and neck supple.  Lymphadenopathy:     Cervical: No cervical  adenopathy.     Lower Body: No right inguinal adenopathy. No left inguinal adenopathy.  Neurological:     General: No focal deficit present.     Mental Status: She is alert and oriented to person, place, and time.     Cranial Nerves: No cranial nerve deficit.  Skin:    General: Skin is warm and dry.     Findings: No erythema or rash.  Psychiatric:        Mood and Affect: Mood normal.        Behavior: Behavior normal.        Judgment: Judgment normal.    Female chaperone present for pelvic and breast  portions of the physical exam  Results: AUDIT Questionnaire (screen for alcoholism): 5 PHQ-9: 3   Assessment: 30 y.o. [id number] female here for routine annual gynecologic examination  Plan: Problem List Items Addressed This Visit   None Visit Diagnoses     Women's annual routine gynecological examination    -  Primary   Screening for depression       Screening for alcoholism           Screening: -- Blood pressure screen normal -- Weight screening: normal -- Depression screening negative (PHQ-9) -- Nutrition: normal -- cholesterol screening: not due for screening -- osteoporosis screening: not due -- tobacco screening: not using -- alcohol screening: AUDIT questionnaire indicates low-risk usage. -- family history of breast cancer screening: done. not at high risk. -- no evidence of domestic violence or intimate partner violence. -- STD screening: gonorrhea/chlamydia NAAT not  collected per patient request. -- pap smear not collected per ASCCP guidelines  Vulvar itching: discussed irritation, inflammation, allergy, infection. Nothing apparent today. Discussed using a barrier of some sort (soft cotton underwear, vaseline or aquafor, etc.).  Return to clinic while still bother her, if not helping.   Discussed risks of leaving Nexplanon in place. Should have it come out next year at latest.   Thomasene Mohair, MD 05/26/2021 9:17 AM

## 2021-06-24 DIAGNOSIS — Z03818 Encounter for observation for suspected exposure to other biological agents ruled out: Secondary | ICD-10-CM | POA: Diagnosis not present

## 2021-06-24 DIAGNOSIS — Z20822 Contact with and (suspected) exposure to covid-19: Secondary | ICD-10-CM | POA: Diagnosis not present

## 2021-09-08 ENCOUNTER — Ambulatory Visit
Admission: EM | Admit: 2021-09-08 | Discharge: 2021-09-08 | Disposition: A | Discharge status: Home / Self Care | Payer: BC Managed Care – PPO | Attending: Internal Medicine | Admitting: Internal Medicine

## 2021-09-08 ENCOUNTER — Ambulatory Visit (INDEPENDENT_AMBULATORY_CARE_PROVIDER_SITE_OTHER): Payer: BC Managed Care – PPO

## 2021-09-08 ENCOUNTER — Other Ambulatory Visit: Payer: Self-pay

## 2021-09-08 DIAGNOSIS — J40 Bronchitis, not specified as acute or chronic: Secondary | ICD-10-CM | POA: Diagnosis not present

## 2021-09-08 DIAGNOSIS — R042 Hemoptysis: Secondary | ICD-10-CM | POA: Diagnosis not present

## 2021-09-08 DIAGNOSIS — R059 Cough, unspecified: Secondary | ICD-10-CM

## 2021-09-08 DIAGNOSIS — J0100 Acute maxillary sinusitis, unspecified: Secondary | ICD-10-CM

## 2021-09-08 MED ORDER — PSEUDOEPHEDRINE HCL ER 120 MG PO TB12: 120.0000 mg | ORAL_TABLET | Freq: Two times a day (BID) | ORAL | 0 refills | Status: DC

## 2021-09-08 MED ORDER — CLARITHROMYCIN 500 MG PO TABS: 500.0000 mg | ORAL_TABLET | Freq: Two times a day (BID) | ORAL | 0 refills | Status: DC

## 2021-09-08 MED ORDER — BENZONATATE 200 MG PO CAPS
200.0000 mg | ORAL_CAPSULE | Freq: Three times a day (TID) | ORAL | 0 refills | Status: DC | PRN
Start: 2021-09-08 — End: 2021-09-30

## 2021-09-08 MED ORDER — FLUTICASONE PROPIONATE 50 MCG/ACT NA SUSP: 2.0000 | Freq: Every day | NASAL | 0 refills | Status: DC

## 2021-09-08 NOTE — ED Notes (Signed)
Flu swab collected, unable to cancel order, no flu test strips available with lab

## 2021-09-08 NOTE — ED Triage Notes (Signed)
Pt reports fever started a week ago, cough/congestion (green mucus w/blood), chills, bodyaches, hoarse voice, and green discharge in both eyes that started today   Pt reports + COVID 1 month ago.  Last Sun home Covid test negative.  Has not check temp today, last OTC med at 1 pm today

## 2021-09-08 NOTE — ED Provider Notes (Signed)
[facility]    CSN: [id number] Arrival date & time: 09/08/21  1659      History   Chief Complaint Chief Complaint  Patient presents with   Cough   Flu-liek symptoms    HPI Vicki Lutz is a 30 y.o. female who presents with URI symptoms with fever one week ago. Her family was diagnosed with influenza, so she thought she had that too. Her cough now is productive with green mucous and some blood x 2 days.  Having green drainage from both of her eyes x 2 days and has been using her daughter's eye antibiotic gtts and is helping. She started getting achy yesterday again. Had covid 1 month ago.  Did a home covid test yesterday and was neg.  Today has been having pain on her L face.     Past Medical History:  Diagnosis Date   Family history of congenital anomalies    paternal aunt had two children with omphalacele; paternal uncle has child with muscular wasting of lower extremeties   Family history of congenital heart defect    maternal cousin    Patient Active Problem List   Diagnosis Date Noted   Depression 01/17/2018   Postpartum care following vaginal delivery 04/06/2017    Past Surgical History:  Procedure Laterality Date   NO PAST SURGERIES      OB History     Gravida  3   Para  2   Term  2   Preterm      AB  1   Living  2      SAB  1   IAB      Ectopic      Multiple  0   Live Births  2            Home Medications    Prior to Admission medications   Medication Sig Start Date End Date Taking? Authorizing Provider  benzonatate (TESSALON) 200 MG capsule Take 1 capsule (200 mg total) by mouth 3 (three) times daily as needed for cough. 09/08/21  Yes Rodriguez-Southworth, Nettie Elm, PA-C  clarithromycin (BIAXIN) 500 MG tablet Take 1 tablet (500 mg total) by mouth 2 (two) times daily. 09/08/21  Yes Rodriguez-Southworth, Nettie Elm, PA-C  etonogestrel (NEXPLANON) 68 MG IMPL implant 1 each by Subdermal route once. 01/17/18 09/08/21 Yes [provider]  fluticasone (FLONASE) 50 MCG/ACT nasal spray Place 2 sprays into both nostrils daily. 09/08/21  Yes Rodriguez-Southworth, Nettie Elm, PA-C  pseudoephedrine (SUDAFED 12 HOUR) 120 MG 12 hr tablet Take 1 tablet (120 mg total) by mouth 2 (two) times daily. 09/08/21  Yes Rodriguez-Southworth, Nettie Elm, PA-C    Family History Family History  Problem Relation Age of Onset   Hyperlipidemia Father    Hypertension Maternal Grandmother    Diabetes Paternal Grandmother    Diabetes Paternal Aunt    Cancer Mother    Cancer Brother     Social History Social History   Tobacco Use   Smoking status: Never   Smokeless tobacco: Never  Vaping Use   Vaping Use: Never used  Substance Use Topics   Alcohol use: No   Drug use: No     Allergies   Patient has no known allergies.   Review of Systems Review of Systems  Constitutional:  Positive for chills and fatigue. Negative for appetite change and diaphoresis.  HENT:  Positive for congestion, sinus pressure, sinus pain and voice change. Negative for ear pain, facial swelling, sore throat and trouble swallowing.  Eyes:  Positive for discharge. Negative for photophobia, pain, redness and itching.  Respiratory:  Positive for cough. Negative for chest tightness and shortness of breath.   Cardiovascular:  Negative for chest pain.  Musculoskeletal:  Positive for myalgias.  Skin:  Negative for rash.  Neurological:  Positive for headaches.  Hematological:  Negative for adenopathy.    Physical Exam Triage Vital Signs ED Triage Vitals  Enc Vitals Group     BP 09/08/21 1838 122/70     Pulse Rate 09/08/21 1838 88     Resp --      Temp 09/08/21 1838 99.1 F (37.3 C)     Temp Source 09/08/21 1838 Oral     SpO2 09/08/21 1838 100 %     Weight 09/08/21 1837 155 lb (70.3 kg)     Height 09/08/21 1837 5\' 1"  (1.549 m)     Head Circumference --      Peak Flow --      Pain Score 09/08/21 1837 6     Pain Loc --      Pain Edu? --      Excl. in  GC? --    No data found.  Updated Vital Signs BP 122/70 (BP Location: Right Arm)   Pulse 88   Temp 99.1 F (37.3 C) (Oral)   Ht 5\' 1"  (1.549 m)   Wt 155 lb (70.3 kg)   LMP 09/08/2021   SpO2 100%   BMI 29.29 kg/m   Visual Acuity Right Eye Distance:   Left Eye Distance:   Bilateral Distance:    Right Eye Near:   Left Eye Near:    Bilateral Near:     Physical Exam Vitals and nursing note reviewed.  Constitutional:      General: She is not in acute distress.    Appearance: Normal appearance.     Comments: She is horsed  HENT:     Head: Normocephalic.     Right Ear: Tympanic membrane, ear canal and external ear normal.     Left Ear: Tympanic membrane, ear canal and external ear normal.     Nose: Congestion present.     Comments: Has tenderness on L maxillary and both frontal and ethmoid sinuses     Mouth/Throat:     Mouth: Mucous membranes are moist.     Pharynx: Oropharynx is clear.  Eyes:     General: No scleral icterus.    Extraocular Movements: Extraocular movements intact.     Conjunctiva/sclera: Conjunctivae normal.     Pupils: Pupils are equal, round, and reactive to light.  Neck:     Comments: L sternocleidomastoid muscle is a little tender Cardiovascular:     Rate and Rhythm: Normal rate and regular rhythm.     Heart sounds: No murmur heard. Pulmonary:     Effort: Pulmonary effort is normal.     Breath sounds: Normal breath sounds. No wheezing, rhonchi or rales.  Musculoskeletal:        General: Normal range of motion.     Cervical back: Neck supple.  Skin:    General: Skin is warm and dry.     Findings: No rash.  Neurological:     Mental Status: She is alert and oriented to person, place, and time.     Gait: Gait normal.  Psychiatric:        Mood and Affect: Mood normal.        Behavior: Behavior normal.        Thought  Content: Thought content normal.        Judgment: Judgment normal.     UC Treatments / Results  Labs (all labs ordered are  listed, but only abnormal results are displayed) Labs Reviewed  RAPID INFLUENZA A&B ANTIGENS    EKG   Radiology DG Chest 2 View  Result Date: 09/08/2021 CLINICAL DATA:  Cough and hemoptysis EXAM: CHEST - 2 VIEW COMPARISON:  None. FINDINGS: The heart size and mediastinal contours are within normal limits. Both lungs are clear. The visualized skeletal structures are unremarkable. IMPRESSION: No active cardiopulmonary disease. Electronically Signed   By: Deatra Robinson M.D.   On: 09/08/2021 19:18    Procedures Procedures (including critical care time)  Medications Ordered in UC Medications - No data to display  Initial Impression / Assessment and Plan / UC Course  I have reviewed the triage vital signs and the nursing notes. I believe she has secondary infection from possibly what sounds was influenza Has L maxillary sinusitis, conjunctivitis improving with current treatment, and bronchitis. I placed her on Sudafed, Flonase, Tessalon, and Biaxen as noted.  Final Clinical Impressions(s) / UC Diagnoses   Final diagnoses:  Bronchitis  Acute non-recurrent maxillary sinusitis   Discharge Instructions   None    ED Prescriptions     Medication Sig Dispense Auth. Provider   benzonatate (TESSALON) 200 MG capsule Take 1 capsule (200 mg total) by mouth 3 (three) times daily as needed for cough. 30 capsule Rodriguez-Southworth, Nettie Elm, PA-C   pseudoephedrine (SUDAFED 12 HOUR) 120 MG 12 hr tablet Take 1 tablet (120 mg total) by mouth 2 (two) times daily. 20 tablet Rodriguez-Southworth, Nettie Elm, PA-C   fluticasone (FLONASE) 50 MCG/ACT nasal spray Place 2 sprays into both nostrils daily. 18 g Rodriguez-Southworth, [place] Virrueta, PA-C   clarithromycin (BIAXIN) 500 MG tablet Take 1 tablet (500 mg total) by mouth 2 (two) times daily. 20 tablet Rodriguez-Southworth, Nettie Elm, PA-C      PDMP not reviewed this encounter.   Garey Ham, Cordelia Poche 09/08/21 2002

## 2021-09-30 ENCOUNTER — Ambulatory Visit
Admission: EM | Admit: 2021-09-30 | Discharge: 2021-09-30 | Disposition: A | Discharge status: Home / Self Care | Payer: BC Managed Care – PPO | Attending: Emergency Medicine | Admitting: Emergency Medicine

## 2021-09-30 DIAGNOSIS — J069 Acute upper respiratory infection, unspecified: Secondary | ICD-10-CM

## 2021-09-30 MED ORDER — PROMETHAZINE-DM 6.25-15 MG/5ML PO SYRP: 5.0000 mL | ORAL_SOLUTION | Freq: Four times a day (QID) | ORAL | 0 refills | Status: DC | PRN

## 2021-09-30 MED ORDER — IPRATROPIUM BROMIDE 0.06 % NA SOLN: 2.0000 | Freq: Four times a day (QID) | NASAL | 12 refills | Status: DC

## 2021-09-30 NOTE — ED Triage Notes (Signed)
Pt c/o cough, fatigue, headache, congestion, SOB, x60month. Pt was seen 2 weeks ago for a sinus infection. Pt has completed the medicine. Pt states that she is having chest pain due to the coughing.   Pt had the flu on 08/29/21

## 2021-09-30 NOTE — ED Provider Notes (Signed)
[facility]    CSN: [id number] Arrival date & time: 09/30/21  1708      History   Chief Complaint Chief Complaint  Patient presents with   Cough    HPI Vicki Lutz is a 30 y.o. female.   HPI  30 year old female here for evaluation of respiratory complaints.  Patient reports that she has been experiencing headache, nasal congestion, fatigue, cough, and shortness of breath for the past month.  She was evaluated in this urgent care on 09/08/2021 and treated for bronchitis and maxillary sinusitis with Tessalon Perles and clarithromycin.  She reports that she is continuing to have nasal discharge but her nasal discharge has become clear, she is having significant amount of postnasal drip, her cough is still productive but it too has become clear, and the shortness of breath is mostly with cough or exertion.  She is also had some intermittent nausea.  She denies any fever, wheezing, vomiting, or diarrhea.  Past Medical History:  Diagnosis Date   Family history of congenital anomalies    paternal aunt had two children with omphalacele; paternal uncle has child with muscular wasting of lower extremeties   Family history of congenital heart defect    maternal cousin    Patient Active Problem List   Diagnosis Date Noted   Depression 01/17/2018   Postpartum care following vaginal delivery 04/06/2017    Past Surgical History:  Procedure Laterality Date   NO PAST SURGERIES      OB History     Gravida  3   Para  2   Term  2   Preterm      AB  1   Living  2      SAB  1   IAB      Ectopic      Multiple  0   Live Births  2            Home Medications    Prior to Admission medications   Medication Sig Start Date End Date Taking? Authorizing Provider  ipratropium (ATROVENT) 0.06 % nasal spray Place 2 sprays into both nostrils 4 (four) times daily. 09/30/21  Yes Becky Augusta, NP  promethazine-dextromethorphan (PROMETHAZINE-DM) 6.25-15 MG/5ML  syrup Take 5 mLs by mouth 4 (four) times daily as needed. 09/30/21  Yes Becky Augusta, NP  etonogestrel (NEXPLANON) 68 MG IMPL implant 1 each by Subdermal route once. 01/17/18 09/08/21  [provider]    Family History Family History  Problem Relation Age of Onset   Hyperlipidemia Father    Hypertension Maternal Grandmother    Diabetes Paternal Grandmother    Diabetes Paternal Aunt    Cancer Mother    Cancer Brother     Social History Social History   Tobacco Use   Smoking status: Never   Smokeless tobacco: Never  Vaping Use   Vaping Use: Never used  Substance Use Topics   Alcohol use: No   Drug use: No     Allergies   Patient has no known allergies.   Review of Systems Review of Systems  Constitutional:  Negative for activity change, appetite change and fever.  HENT:  Positive for congestion, postnasal drip and rhinorrhea.   Respiratory:  Positive for cough and shortness of breath. Negative for wheezing.   Gastrointestinal:  Positive for nausea. Negative for diarrhea and vomiting.  Skin:  Negative for rash.  Hematological: Negative.   Psychiatric/Behavioral: Negative.      Physical Exam Triage Vital Signs ED  Triage Vitals  Enc Vitals Group     BP 09/30/21 1751 111/66     Pulse Rate 09/30/21 1751 76     Resp 09/30/21 1751 18     Temp 09/30/21 1751 98.9 F (37.2 C)     Temp Source 09/30/21 1751 Oral     SpO2 09/30/21 1751 99 %     Weight 09/30/21 1749 153 lb (69.4 kg)     Height 09/30/21 1749 5\' 1"  (1.549 m)     Head Circumference --      Peak Flow --      Pain Score 09/30/21 1749 8     Pain Loc --      Pain Edu? --      Excl. in GC? --    No data found.  Updated Vital Signs BP 111/66 (BP Location: Left Arm)    Pulse 76    Temp 98.9 F (37.2 C) (Oral)    Resp 18    Ht 5\' 1"  (1.549 m)    Wt 153 lb (69.4 kg)    LMP 09/08/2021    SpO2 99%    BMI 28.91 kg/m   Visual Acuity Right Eye Distance:   Left Eye Distance:   Bilateral Distance:     Right Eye Near:   Left Eye Near:    Bilateral Near:     Physical Exam Vitals and nursing note reviewed.  Constitutional:      General: She is not in acute distress.    Appearance: Normal appearance. She is not ill-appearing.  HENT:     Head: Normocephalic and atraumatic.     Right Ear: Tympanic membrane, ear canal and external ear normal. There is no impacted cerumen.     Left Ear: Tympanic membrane, ear canal and external ear normal. There is no impacted cerumen.     Nose: Congestion and rhinorrhea present.     Mouth/Throat:     Mouth: Mucous membranes are moist.     Pharynx: Oropharynx is clear. Posterior oropharyngeal erythema present.  Cardiovascular:     Rate and Rhythm: Normal rate and regular rhythm.     Pulses: Normal pulses.     Heart sounds: Normal heart sounds. No murmur heard.   No gallop.  Pulmonary:     Effort: Pulmonary effort is normal.     Breath sounds: Normal breath sounds. No wheezing, rhonchi or rales.  Musculoskeletal:     Cervical back: Normal range of motion and neck supple.  Lymphadenopathy:     Cervical: No cervical adenopathy.  Skin:    General: Skin is warm and dry.     Capillary Refill: Capillary refill takes less than 2 seconds.     Findings: No erythema or rash.  Neurological:     General: No focal deficit present.     Mental Status: She is alert and oriented to person, place, and time.  Psychiatric:        Mood and Affect: Mood normal.        Behavior: Behavior normal.        Thought Content: Thought content normal.        Judgment: Judgment normal.     UC Treatments / Results  Labs (all labs ordered are listed, but only abnormal results are displayed) Labs Reviewed - No data to display  EKG   Radiology No results found.  Procedures Procedures (including critical care time)  Medications Ordered in UC Medications - No data to display  Initial Impression /  Assessment and Plan / UC Course  I have reviewed the triage vital  signs and the nursing notes.  Pertinent labs & imaging results that were available during my care of the patient were reviewed by me and considered in my medical decision making (see chart for details).  Patient is a very pleasant, nontoxic-appearing 30 year old female here for continued respiratory symptoms that have been present for the past month but are improving.  She is still continuing to have nasal discharge which is clear, postnasal drip, and sputum production which is also become clear.  Her physical exam reveals protegrin tympanic membranes bilaterally with normal reflex and clear external auditory canals.  Nasal mucosa is erythematous and edematous with clear nasal discharge.  Oropharyngeal exam reveals posterior oropharyngeal erythema with clear postnasal drip.  No cervical lymphadenopathy appreciated on exam.  Lungs are clear to auscultation all fields.  Patient is concerned that she may have developed pneumonia since she has been sick for so long but I assured her that the clarithromycin that she was taking for her maxillary sinusitis would cover a potential source of pneumonia and also that she should be reassured that she is no longer having purulent sputum and her sputum has become clear.  I believe her cough is being driven by the nasal congestion and postnasal drip.  I will treat her with Atrovent nasal spray to help with the nasal congestion and Promethazine DM cough syrup.  She was prescribed Tessalon Perles earlier but she states that she did not receive any relief from those.  I advised her then to use Delsym, Zarbee's, or Robitussin during the day as needed for cough.   Final Clinical Impressions(s) / UC Diagnoses   Final diagnoses:  Viral URI with cough     Discharge Instructions      Use the Atrovent nasal spray, 2 squirts in each nostril every 6 hours, as needed for runny nose and postnasal drip.  Use OTC Delsym, Zarbee's, or Robitussin during the day as needed for  cough.  Use the Promethazine DM cough syrup at bedtime for cough and congestion.  It will make you drowsy so do not take it during the day.  Return for reevaluation or see your primary care provider for any new or worsening symptoms.      ED Prescriptions     Medication Sig Dispense Auth. Provider   ipratropium (ATROVENT) 0.06 % nasal spray Place 2 sprays into both nostrils 4 (four) times daily. 15 mL Becky Augusta, NP   promethazine-dextromethorphan (PROMETHAZINE-DM) 6.25-15 MG/5ML syrup Take 5 mLs by mouth 4 (four) times daily as needed. 118 mL Becky Augusta, NP      PDMP not reviewed this encounter.   Becky Augusta, NP 09/30/21 1810

## 2021-09-30 NOTE — Discharge Instructions (Signed)
Use the Atrovent nasal spray, 2 squirts in each nostril every 6 hours, as needed for runny nose and postnasal drip.  Use OTC Delsym, Zarbee's, or Robitussin during the day as needed for cough.  Use the Promethazine DM cough syrup at bedtime for cough and congestion.  It will make you drowsy so do not take it during the day.  Return for reevaluation or see your primary care provider for any new or worsening symptoms.

## 2021-12-02 ENCOUNTER — Encounter: Payer: Self-pay | Admitting: Internal Medicine

## 2021-12-02 ENCOUNTER — Ambulatory Visit (INDEPENDENT_AMBULATORY_CARE_PROVIDER_SITE_OTHER): Payer: BC Managed Care – PPO | Admitting: Internal Medicine

## 2021-12-02 ENCOUNTER — Other Ambulatory Visit: Payer: Self-pay

## 2021-12-02 VITALS — BP 119/65 | HR 86 | Ht 61.0 in | Wt 158.2 lb

## 2021-12-02 DIAGNOSIS — F322 Major depressive disorder, single episode, severe without psychotic features: Secondary | ICD-10-CM

## 2021-12-02 DIAGNOSIS — J00 Acute nasopharyngitis [common cold]: Secondary | ICD-10-CM

## 2021-12-02 MED ORDER — AZITHROMYCIN 250 MG PO TABS: ORAL_TABLET | ORAL | 0 refills | Status: AC

## 2021-12-02 NOTE — Progress Notes (Signed)
Established Patient Office Visit  Subjective:  Patient ID: Vicki Lutz, female    DOB: [birth year]  Age: 31 y.o. MRN: [id number]  CC:  Chief Complaint  Patient presents with   Neck Pain    Patient complains of neck pain since Friday. Patient feels her throat is sore to the touch and tender with also swelling.    Neck Pain  This is a new problem. The current episode started in the past 7 days. The problem has been waxing and waning. The pain is present in the anterior neck and left side. The quality of the pain is described as aching and burning. The pain is at a severity of 4/10. The pain is moderate. The symptoms are aggravated by coughing. Associated symptoms include headaches and pain with swallowing. Pertinent negatives include no fever.   Vicki Lutz presents for sorre throat  Past Medical History:  Diagnosis Date   Family history of congenital anomalies    paternal aunt had two children with omphalacele; paternal uncle has child with muscular wasting of lower extremeties   Family history of congenital heart defect    maternal cousin    Past Surgical History:  Procedure Laterality Date   NO PAST SURGERIES      Family History  Problem Relation Age of Onset   Hyperlipidemia Father    Hypertension Maternal Grandmother    Diabetes Paternal Grandmother    Diabetes Paternal Aunt    Cancer Mother    Cancer Brother     Social History   Socioeconomic History   Marital status: Married    Spouse name: Not on file   Number of children: Not on file   Years of education: Not on file   Highest education level: Not on file  Occupational History   Not on file  Tobacco Use   Smoking status: Never   Smokeless tobacco: Never  Vaping Use   Vaping Use: Never used  Substance and Sexual Activity   Alcohol use: No   Drug use: No   Sexual activity: Yes    Birth control/protection: Implant  Other Topics Concern   Not on file  Social History Narrative   Not on file    Social Determinants of Health   Financial Resource Strain: Not on file  Food Insecurity: Not on file  Transportation Needs: Not on file  Physical Activity: Not on file  Stress: Not on file  Social Connections: Not on file  Intimate Partner Violence: Not on file     Current Outpatient Medications:    ipratropium (ATROVENT) 0.06 % nasal spray, Place 2 sprays into both nostrils 4 (four) times daily., Disp: 15 mL, Rfl: 12   promethazine-dextromethorphan (PROMETHAZINE-DM) 6.25-15 MG/5ML syrup, Take 5 mLs by mouth 4 (four) times daily as needed., Disp: 118 mL, Rfl: 0   etonogestrel (NEXPLANON) 68 MG IMPL implant, 1 each by Subdermal route once., Disp: , Rfl:    No Known Allergies  ROS Review of Systems  Constitutional:  Negative for fever.  Musculoskeletal:  Positive for neck pain.  Neurological:  Positive for headaches.     Objective:    Physical Exam Vitals reviewed.  Constitutional:      Appearance: Normal appearance.  HENT:     Right Ear: Ear canal normal.     Left Ear: Ear canal normal.     Nose: No rhinorrhea.     Mouth/Throat:     Mouth: Mucous membranes are moist.     Pharynx: Oropharyngeal exudate and  posterior oropharyngeal erythema present.  Eyes:     Pupils: Pupils are equal, round, and reactive to light.  Neck:     Vascular: No carotid bruit.  Cardiovascular:     Rate and Rhythm: Normal rate and regular rhythm.     Pulses: Normal pulses.     Heart sounds: Normal heart sounds.  Pulmonary:     Effort: Pulmonary effort is normal.     Breath sounds: Normal breath sounds.  Abdominal:     General: Bowel sounds are normal.     Palpations: Abdomen is soft. There is no hepatomegaly, splenomegaly or mass.     Tenderness: There is no abdominal tenderness.     Hernia: No hernia is present.  Musculoskeletal:        General: No tenderness.     Cervical back: Neck supple.     Right lower leg: No edema.     Left lower leg: No edema.  Skin:    Findings: No rash.   Neurological:     Mental Status: She is alert and oriented to person, place, and time.     Motor: No weakness.  Psychiatric:        Mood and Affect: Mood and affect normal.        Behavior: Behavior normal.    BP 119/65    Pulse 86    Ht 5' 1"  (1.549 m)    Wt 158 lb 3.2 oz (71.8 kg)    BMI 29.89 kg/m  Wt Readings from Last 3 Encounters:  12/02/21 158 lb 3.2 oz (71.8 kg)  09/30/21 153 lb (69.4 kg)  09/08/21 155 lb (70.3 kg)     Health Maintenance Due  Topic Date Due   Hepatitis C Screening  Never done   INFLUENZA VACCINE  05/05/2021    There are no preventive care reminders to display for this patient.  No results found for: TSH Lab Results  Component Value Date   WBC 12.0 (H) 04/05/2017   HGB 12.1 04/05/2017   HCT 34.7 (L) 04/05/2017   MCV 87.0 04/05/2017   PLT 198 04/05/2017   No results found for: NA, K, CHLORIDE, CO2, GLUCOSE, BUN, CREATININE, BILITOT, ALKPHOS, AST, ALT, PROT, ALBUMIN, CALCIUM, ANIONGAP, EGFR, GFR Lab Results  Component Value Date   CHOL 202 (A) 11/26/2016   Lab Results  Component Value Date   HDL 79 (A) 11/26/2016   Lab Results  Component Value Date   LDLCALC 89 11/26/2016   Lab Results  Component Value Date   TRIG 169 (A) 11/26/2016   No results found for: CHOLHDL No results found for: HGBA1C    Assessment & Plan:   Problem List Items Addressed This Visit       Respiratory   Acute nasopharyngitis - Primary    Patient is tender in the frontal neck on the left side with a small lymph node.  Throat is mildly congested.  Ear examination revealed minimal effusion        Other   Depression    Stable       No orders of the defined types were placed in this encounter.   Follow-up: No follow-ups on file.    Cletis Athens, MD

## 2021-12-02 NOTE — Assessment & Plan Note (Signed)
Patient is tender in the frontal neck on the left side with a small lymph node.  Throat is mildly congested.  Ear examination revealed minimal effusion

## 2021-12-02 NOTE — Assessment & Plan Note (Signed)
Stable

## 2021-12-29 LAB — FETAL NONSTRESS TEST

## 2022-02-05 ENCOUNTER — Ambulatory Visit: Payer: BC Managed Care – PPO | Admitting: Obstetrics

## 2022-02-05 ENCOUNTER — Encounter: Payer: Self-pay | Admitting: Advanced Practice Midwife

## 2022-02-05 ENCOUNTER — Ambulatory Visit: Payer: BC Managed Care – PPO | Admitting: Advanced Practice Midwife

## 2022-02-05 VITALS — BP 120/80 | Ht 61.0 in | Wt 157.0 lb

## 2022-02-05 DIAGNOSIS — Z3046 Encounter for surveillance of implantable subdermal contraceptive: Secondary | ICD-10-CM | POA: Diagnosis not present

## 2022-02-05 NOTE — Progress Notes (Signed)
? ?  GYNECOLOGY PROCEDURE NOTE ? ?Nexplanon removal discussed in detail.  Risks of infection, bleeding, nerve injury all reviewed.  Patient understands risks and desires to proceed.  Verbal consent obtained.  She had the device placed in 2019. Patient is certain she wants the Nexplanon removed.  All questions answered. ? ?Review of Systems  ?Constitutional:  Negative for chills and fever.  ?HENT:  Negative for congestion, ear discharge, ear pain, hearing loss, sinus pain and sore throat.   ?Eyes:  Negative for blurred vision and double vision.  ?Respiratory:  Negative for cough, shortness of breath and wheezing.   ?Cardiovascular:  Negative for chest pain, palpitations and leg swelling.  ?Gastrointestinal:  Negative for abdominal pain, blood in stool, constipation, diarrhea, heartburn, melena, nausea and vomiting.  ?Genitourinary:  Negative for dysuria, flank pain, frequency, hematuria and urgency.  ?Musculoskeletal:  Negative for back pain, joint pain and myalgias.  ?Skin:  Negative for itching and rash.  ?Neurological:  Negative for dizziness, tingling, tremors, sensory change, speech change, focal weakness, seizures, loss of consciousness, weakness and headaches.  ?Endo/Heme/Allergies:  Negative for environmental allergies. Does not bruise/bleed easily.  ?Psychiatric/Behavioral:  Negative for depression, hallucinations, memory loss, substance abuse and suicidal ideas. The patient is not nervous/anxious and does not have insomnia.   ? ?Vital Signs: BP 120/80   Ht 5\' 1"  (1.549 m)   Wt 157 lb (71.2 kg)   LMP 01/11/2022   BMI 29.66 kg/m?  ?Constitutional: Well nourished, well developed female in no acute distress.  ?HEENT: normal ?Skin: Warm and dry ?Respiratory:  Normal respiratory effort ?Neuro: DTRs 2+, Cranial nerves grossly intact ?Psych: Alert and Oriented x3. No memory deficits. Normal mood and affect.  ? ? ?Procedure: ?Patient placed in dorsal supine with left arm above head, elbow flexed at 90 degrees,  arm resting on examination table.  Nexplanon identified without problems.  Betadine scrub x3.  1 ml of 1% lidocaine injected under Nexplanon device without problems.  Sterile gloves applied.  Small 0.5cm incision made at distal tip of Nexplanon device with 11 blade scalpel.  Nexplanon brought to incision and grasped with a small kelly clamp.  Nexplanon removed intact without problems.  Pressure applied to incision.  Hemostasis obtained.  Steri-strips applied, followed by bandage and compression dressing.  Patient tolerated procedure well.  No complications. ? ? ?Assessment: ?31 y.o. year old female now s/p uncomplicated Nexplanon removal. ? ?Plan: ?1.  Patient given post procedure precautions and asked to call for fever, chills, redness or drainage from her incision, bleeding from incision.  She understands she will likely have a small bruise near site of removal and can remove bandage tomorrow and steri-strips in approximately 1 week. ? ?2) Contraception: condoms/husband planning vasectomy ? ? ?Christean Leaf, CNM ?[facility] ?[place] ?02/05/22, 11:13 AM ? ? ? ?J2001 for lidocaine block, 11982 for nexplanon removal  ?

## 2022-02-25 ENCOUNTER — Ambulatory Visit
Admission: EM | Admit: 2022-02-25 | Discharge: 2022-02-25 | Disposition: A | Discharge status: Home / Self Care | Payer: BC Managed Care – PPO

## 2022-02-25 DIAGNOSIS — J069 Acute upper respiratory infection, unspecified: Secondary | ICD-10-CM

## 2022-02-25 DIAGNOSIS — J0100 Acute maxillary sinusitis, unspecified: Secondary | ICD-10-CM

## 2022-02-25 MED ORDER — PROMETHAZINE-DM 6.25-15 MG/5ML PO SYRP: 5.0000 mL | ORAL_SOLUTION | Freq: Four times a day (QID) | ORAL | 0 refills | Status: DC | PRN

## 2022-02-25 MED ORDER — IPRATROPIUM BROMIDE 0.06 % NA SOLN: 2.0000 | Freq: Four times a day (QID) | NASAL | 12 refills | Status: DC

## 2022-02-25 MED ORDER — AMOXICILLIN-POT CLAVULANATE 875-125 MG PO TABS: 1.0000 | ORAL_TABLET | Freq: Two times a day (BID) | ORAL | 0 refills | Status: AC

## 2022-02-25 MED ORDER — BENZONATATE 100 MG PO CAPS: 200.0000 mg | ORAL_CAPSULE | Freq: Three times a day (TID) | ORAL | 0 refills | Status: DC

## 2022-02-25 NOTE — ED Provider Notes (Signed)
[facility]    CSN: [id number] Arrival date & time: 02/25/22  1239      History   Chief Complaint Chief Complaint  Patient presents with   Nasal Congestion   Otalgia   Generalized Body Aches    HPI Vicki Lutz is a 31 y.o. female.   HPI  31 year old female here for evaluation of respiratory complaints.  Patient reports that her symptoms began 4 days ago with a sore throat.  The following day she developed a runny nose, nasal congestion, chills, body aches, and headache.  Those have continued through until yesterday.  This morning she is complaining of bilateral ear pain and especially pain below her right ear into her neck.  She still has significant congestion and is complained of a nonproductive cough.  No fever but does report feeling feverish.  She denies any changes in hearing or ringing in her ears.  No shortness of breath or wheezing.  No GI complaints.  Past Medical History:  Diagnosis Date   Family history of congenital anomalies    paternal aunt had two children with omphalacele; paternal uncle has child with muscular wasting of lower extremeties   Family history of congenital heart defect    maternal cousin    Patient Active Problem List   Diagnosis Date Noted   Acute nasopharyngitis 12/02/2021   Depression 01/17/2018    Past Surgical History:  Procedure Laterality Date   NO PAST SURGERIES      OB History     Gravida  3   Para  2   Term  2   Preterm      AB  1   Living  2      SAB  1   IAB      Ectopic      Multiple  0   Live Births  2            Home Medications    Prior to Admission medications   Medication Sig Start Date End Date Taking? Authorizing Provider  amoxicillin-clavulanate (AUGMENTIN) 875-125 MG tablet Take 1 tablet by mouth every 12 (twelve) hours for 10 days. 02/25/22 03/07/22 Yes Margarette Canada, NP  benzonatate (TESSALON) 100 MG capsule Take 2 capsules (200 mg total) by mouth every 8 (eight) hours.  02/25/22  Yes Margarette Canada, NP  ipratropium (ATROVENT) 0.06 % nasal spray Place 2 sprays into both nostrils 4 (four) times daily. 02/25/22  Yes Margarette Canada, NP  promethazine-dextromethorphan (PROMETHAZINE-DM) 6.25-15 MG/5ML syrup Take 5 mLs by mouth 4 (four) times daily as needed. 02/25/22  Yes Margarette Canada, NP    Family History Family History  Problem Relation Age of Onset   Hyperlipidemia Father    Hypertension Maternal Grandmother    Diabetes Paternal Grandmother    Diabetes Paternal Aunt    Cancer Mother    Cancer Brother     Social History Social History   Tobacco Use   Smoking status: Never   Smokeless tobacco: Never  Vaping Use   Vaping Use: Never used  Substance Use Topics   Alcohol use: No   Drug use: No     Allergies   Patient has no known allergies.   Review of Systems Review of Systems  Constitutional:  Negative for activity change, appetite change and fever.  HENT:  Positive for congestion, ear pain, rhinorrhea, sinus pressure and sore throat.   Respiratory:  Positive for cough. Negative for shortness of breath and wheezing.   Gastrointestinal:  Negative for diarrhea, nausea and vomiting.  Skin:  Negative for rash.  Hematological: Negative.   Psychiatric/Behavioral: Negative.      Physical Exam Triage Vital Signs ED Triage Vitals  Enc Vitals Group     BP      Pulse      Resp      Temp      Temp src      SpO2      Weight      Height      Head Circumference      Peak Flow      Pain Score      Pain Loc      Pain Edu?      Excl. in [place]?    No data found.  Updated Vital Signs BP 108/64 (BP Location: Left Arm)   Pulse 76   Temp 98.4 F (36.9 C) (Oral)   Resp 18   Ht $R'5\' 1"'mj$  (1.549 m)   Wt 154 lb (69.9 kg)   LMP 02/09/2022 (Exact Date)   SpO2 100%   BMI 29.10 kg/m   Visual Acuity Right Eye Distance:   Left Eye Distance:   Bilateral Distance:    Right Eye Near:   Left Eye Near:    Bilateral Near:     Physical Exam Vitals and  nursing note reviewed.  Constitutional:      Appearance: Normal appearance. She is not ill-appearing.  HENT:     Head: Normocephalic and atraumatic.     Right Ear: Tympanic membrane, ear canal and external ear normal. There is no impacted cerumen.     Left Ear: Tympanic membrane, ear canal and external ear normal. There is no impacted cerumen.     Ears:     Comments: Does have tenderness to palpation of bilateral eustachian tubes.  The right eustachian tube is more tender than the left.    Nose: Congestion and rhinorrhea present.     Mouth/Throat:     Mouth: Mucous membranes are moist.     Pharynx: Oropharynx is clear. Posterior oropharyngeal erythema present. No oropharyngeal exudate.  Cardiovascular:     Rate and Rhythm: Normal rate and regular rhythm.     Pulses: Normal pulses.     Heart sounds: Normal heart sounds. No murmur heard.   No friction rub. No gallop.  Pulmonary:     Effort: Pulmonary effort is normal.     Breath sounds: Normal breath sounds. No wheezing, rhonchi or rales.  Musculoskeletal:     Cervical back: Normal range of motion and neck supple.  Lymphadenopathy:     Cervical: Cervical adenopathy present.  Skin:    General: Skin is warm and dry.     Capillary Refill: Capillary refill takes less than 2 seconds.     Findings: No erythema or rash.  Neurological:     General: No focal deficit present.     Mental Status: She is alert and oriented to person, place, and time.  Psychiatric:        Mood and Affect: Mood normal.        Behavior: Behavior normal.        Thought Content: Thought content normal.        Judgment: Judgment normal.     UC Treatments / Results  Labs (all labs ordered are listed, but only abnormal results are displayed) Labs Reviewed - No data to display  EKG   Radiology No results found.  Procedures Procedures (including critical care time)  Medications Ordered in UC Medications - No data to display  Initial Impression /  Assessment and Plan / UC Course  I have reviewed the triage vital signs and the nursing notes.  Pertinent labs & imaging results that were available during my care of the patient were reviewed by me and considered in my medical decision making (see chart for details).  Is a very pleasant, nontoxic-appearing 31 year old female here for evaluation of respiratory complaints as outlined HPI above.  Her physical exam reveals pearly-gray tympanic membranes bilaterally with normal light reflex and clear external auditory canals.  Both of her eustachian tubes are tender to palpation when palpated externally.  Nasal mucosa is erythematous and edematous with copious clear discharge in both nares.  Oropharyngeal exam reveals posterior oropharyngeal erythema and injection with clear postnasal drip.  Bilateral anterior cervical of adenopathy is present on exam.  Cardiopulmonary exam reveals S1-S2 heart sounds with regular rate and rhythm and lung sounds that are clear to auscultation in all fields.  Patient does have tenderness to percussion of bilateral maxillary sinuses.  Patient exam is consistent with an upper respiratory infection and maxillary sinusitis.  I will treat her with Augmentin twice daily for 10 days and I have encouraged her to perform sinus irrigation to help alleviate her mucus burden and pressure in her face.  I will also give her Atrovent nasal spray to help with the congestion, Tessalon Perles help with cough during the day, and Promethazine DM to help with cough at night.  Patient denies need for work note.   Final Clinical Impressions(s) / UC Diagnoses   Final diagnoses:  Acute non-recurrent maxillary sinusitis  Upper respiratory tract infection, unspecified type     Discharge Instructions      The Augmentin twice daily with food for 10 days for treatment of your sinusitis.  Perform sinus irrigation 2-3 times a day with a NeilMed sinus rinse kit and distilled water.  Do not use tap  water.  You can use plain over-the-counter Mucinex every 6 hours to break up the stickiness of the mucus so your body can clear it.  Increase your oral fluid intake to thin out your mucus so that is also able for your body to clear more easily.  Take an over-the-counter probiotic, such as Culturelle-align-activia, 1 hour after each dose of antibiotic to prevent diarrhea.  Use the Atrovent nasal spray, 2 squirts in each nostril every 6 hours, as needed for runny nose and postnasal drip.  Use the Tessalon Perles every 8 hours during the day.  Take them with a small sip of water.  They may give you some numbness to the base of your tongue or a metallic taste in your mouth, this is normal.  Use the Promethazine DM cough syrup at bedtime for cough and congestion.  It will make you drowsy so do not take it during the day.  If you develop any new or worsening symptoms return for reevaluation or see your primary care provider.      ED Prescriptions     Medication Sig Dispense Auth. Provider   amoxicillin-clavulanate (AUGMENTIN) 875-125 MG tablet Take 1 tablet by mouth every 12 (twelve) hours for 10 days. 20 tablet Margarette Canada, NP   ipratropium (ATROVENT) 0.06 % nasal spray Place 2 sprays into both nostrils 4 (four) times daily. 15 mL Margarette Canada, NP   benzonatate (TESSALON) 100 MG capsule Take 2 capsules (200 mg total) by mouth every 8 (eight) hours. 21 capsule Margarette Canada,  NP   promethazine-dextromethorphan (PROMETHAZINE-DM) 6.25-15 MG/5ML syrup Take 5 mLs by mouth 4 (four) times daily as needed. 118 mL Margarette Canada, NP      PDMP not reviewed this encounter.   Margarette Canada, NP 02/25/22 1315

## 2022-02-25 NOTE — ED Triage Notes (Signed)
Patient is here for "sore throat that started Saturday, Sunday started with runny nose, congestion, chills, body aches through Tuesday". No having bilateral ear pain "especially around the ear, below ears and neck'. A lot of "bad congestion'. No fever known. No cough. No sob. COVID19 testing "every day at home" (negative).

## 2022-02-25 NOTE — Discharge Instructions (Signed)
The Augmentin twice daily with food for 10 days for treatment of your sinusitis.  Perform sinus irrigation 2-3 times a day with a NeilMed sinus rinse kit and distilled water.  Do not use tap water.  You can use plain over-the-counter Mucinex every 6 hours to break up the stickiness of the mucus so your body can clear it.  Increase your oral fluid intake to thin out your mucus so that is also able for your body to clear more easily.  Take an over-the-counter probiotic, such as Culturelle-align-activia, 1 hour after each dose of antibiotic to prevent diarrhea.  Use the Atrovent nasal spray, 2 squirts in each nostril every 6 hours, as needed for runny nose and postnasal drip.  Use the Tessalon Perles every 8 hours during the day.  Take them with a small sip of water.  They may give you some numbness to the base of your tongue or a metallic taste in your mouth, this is normal.  Use the Promethazine DM cough syrup at bedtime for cough and congestion.  It will make you drowsy so do not take it during the day.  If you develop any new or worsening symptoms return for reevaluation or see your primary care provider.  

## 2022-04-20 ENCOUNTER — Ambulatory Visit
Admission: EM | Admit: 2022-04-20 | Discharge: 2022-04-20 | Disposition: A | Discharge status: Home / Self Care | Payer: BC Managed Care – PPO | Attending: Physician Assistant | Admitting: Physician Assistant

## 2022-04-20 ENCOUNTER — Other Ambulatory Visit: Payer: Self-pay

## 2022-04-20 ENCOUNTER — Ambulatory Visit (INDEPENDENT_AMBULATORY_CARE_PROVIDER_SITE_OTHER): Payer: BC Managed Care – PPO

## 2022-04-20 DIAGNOSIS — S8012XA Contusion of left lower leg, initial encounter: Secondary | ICD-10-CM

## 2022-04-20 DIAGNOSIS — M79605 Pain in left leg: Secondary | ICD-10-CM

## 2022-04-20 DIAGNOSIS — W19XXXA Unspecified fall, initial encounter: Secondary | ICD-10-CM

## 2022-04-20 DIAGNOSIS — M79662 Pain in left lower leg: Secondary | ICD-10-CM

## 2022-04-20 DIAGNOSIS — M25562 Pain in left knee: Secondary | ICD-10-CM

## 2022-04-20 DIAGNOSIS — Z043 Encounter for examination and observation following other accident: Secondary | ICD-10-CM | POA: Diagnosis not present

## 2022-04-20 NOTE — Discharge Instructions (Addendum)
-  X-ray does not show any fractures or bony abnormality but as we discussed it cannot 100% rule out torn ligaments or meniscus. - Continue to ice and elevate the leg and take ibuprofen and Tylenol for pain.  May apply topical Voltaren gel or topical lidocaine. -Wear the knee brace for support and stability. - If no continued improvement in the next 1 week or if symptoms worsen you may need to follow-up with orthopedics and have an MRI of the knee.  You have a condition requiring you to follow up with Orthopedics so please call one of the following office for appointment:   [facility] [address] [place], [place] [address] Phone: [phone]  [facility] [address], [place], [place] [address] Phone: [phone]

## 2022-04-20 NOTE — ED Provider Notes (Signed)
[facility]    CSN: [id number] Arrival date & time: 04/20/22  1526      History   Chief Complaint Chief Complaint  Patient presents with   Leg Pain    HPI Vicki Lutz is a 31 y.o. female presenting for 5-day history of left knee/tib-fib pain.  Patient reports she was in [place] on vacation and fell straight down in her knee and shin struck the side of the boat and step.  She reports a lot of initial swelling of the left medial knee and over the tibial plateau.  Significant bruising of her shin.  She says pain has improved slightly.  Reports that she has been trying not to directly bear weight on this leg due to the pain.  She is able to walk.  She also reports that her lateral lower leg feels numb.  She has been icing it and taking ibuprofen.  Patient denies any other injuries.  HPI  Past Medical History:  Diagnosis Date   Family history of congenital anomalies    paternal aunt had two children with omphalacele; paternal uncle has child with muscular wasting of lower extremeties   Family history of congenital heart defect    maternal cousin    Patient Active Problem List   Diagnosis Date Noted   Acute nasopharyngitis 12/02/2021   Depression 01/17/2018    Past Surgical History:  Procedure Laterality Date   NO PAST SURGERIES      OB History     Gravida  3   Para  2   Term  2   Preterm      AB  1   Living  2      SAB  1   IAB      Ectopic      Multiple  0   Live Births  2            Home Medications    Prior to Admission medications   Not on File    Family History Family History  Problem Relation Age of Onset   Cancer Mother    Hyperlipidemia Father    Cancer Brother    Hypertension Maternal Grandmother    Diabetes Paternal Grandmother    Diabetes Paternal Aunt     Social History Social History   Tobacco Use   Smoking status: Never   Smokeless tobacco: Never  Vaping Use   Vaping Use: Never used  Substance Use  Topics   Alcohol use: No   Drug use: No     Allergies   Patient has no known allergies.   Review of Systems Review of Systems  Musculoskeletal:  Positive for arthralgias and joint swelling. Negative for gait problem.  Skin:  Positive for color change. Negative for rash and wound.  Neurological:  Positive for numbness. Negative for weakness.     Physical Exam Triage Vital Signs ED Triage Vitals  Enc Vitals Group     BP      Pulse      Resp      Temp      Temp src      SpO2      Weight      Height      Head Circumference      Peak Flow      Pain Score      Pain Loc      Pain Edu?      Excl. in [place]?    No data  found.  Updated Vital Signs BP (P) 93/72 (BP Location: Left Arm)   Pulse (P) 77   Temp (P) 98.4 F (36.9 C) (Oral)   Resp (P) 16   Ht 5\' 1"  (1.549 m)   Wt 153 lb (69.4 kg)   LMP 04/12/2022   SpO2 (P) 100%   BMI 28.91 kg/m   Physical Exam Vitals and nursing note reviewed.  Constitutional:      General: She is not in acute distress.    Appearance: Normal appearance. She is not ill-appearing or toxic-appearing.  HENT:     Head: Normocephalic and atraumatic.  Eyes:     General: No scleral icterus.       Right eye: No discharge.        Left eye: No discharge.     Conjunctiva/sclera: Conjunctivae normal.  Cardiovascular:     Rate and Rhythm: Normal rate and regular rhythm.     Pulses: Normal pulses.     Heart sounds: Normal heart sounds.  Pulmonary:     Effort: Pulmonary effort is normal. No respiratory distress.     Breath sounds: Normal breath sounds.  Musculoskeletal:     Cervical back: Neck supple.     Left knee: Swelling (anterior knee and tib/fib) and ecchymosis (significant, healing contusions of left knee and tib/fib--yellowish coloration now) present. Decreased range of motion. Tenderness (cannot flex beyond 90 degrees) present over the medial joint line.     Comments: TTP tibial plateau, proximal tibia and all along tib/fib to the ankle   Skin:    General: Skin is dry.  Neurological:     General: No focal deficit present.     Mental Status: She is alert. Mental status is at baseline.     Motor: No weakness.     Gait: Gait abnormal.  Psychiatric:        Mood and Affect: Mood normal.        Behavior: Behavior normal.        Thought Content: Thought content normal.      UC Treatments / Results  Labs (all labs ordered are listed, but only abnormal results are displayed) Labs Reviewed - No data to display  EKG   Radiology DG Knee Complete 4 Views Left  Result Date: 04/20/2022 CLINICAL DATA:  Fall EXAM: LEFT KNEE - COMPLETE 4+ VIEW; LEFT TIBIA AND FIBULA - 2 VIEW COMPARISON:  None Available. FINDINGS: Evidence of fracture or dislocation. Moderate left knee joint effusion. No evidence of arthropathy or other focal bone abnormality. Soft tissues are unremarkable. IMPRESSION: No acute osseous abnormality of the left knee, or left tibia/fibula. Electronically Signed   By: 04/22/2022 M.D.   On: 04/20/2022 16:51   DG Tibia/Fibula Left  Result Date: 04/20/2022 CLINICAL DATA:  Fall EXAM: LEFT KNEE - COMPLETE 4+ VIEW; LEFT TIBIA AND FIBULA - 2 VIEW COMPARISON:  None Available. FINDINGS: Evidence of fracture or dislocation. Moderate left knee joint effusion. No evidence of arthropathy or other focal bone abnormality. Soft tissues are unremarkable. IMPRESSION: No acute osseous abnormality of the left knee, or left tibia/fibula. Electronically Signed   By: 04/22/2022 M.D.   On: 04/20/2022 16:51    Procedures Procedures (including critical care time)  Medications Ordered in UC Medications - No data to display  Initial Impression / Assessment and Plan / UC Course  I have reviewed the triage vital signs and the nursing notes.  Pertinent labs & imaging results that were available during my care of the  patient were reviewed by me and considered in my medical decision making (see chart for details).  31 year old  female presenting for left knee and shin pain, swelling and bruising.  Injury 5 days ago.  Improving pain since.  X-rays obtained today do not show any evidence of fractures.  I discussed results with her.  She does have a hematoma and significant contusions.  Advised her that may very well be the cause of her symptoms but I am concerned given that she has reduced range of motion with knee flexion.  Reviewed RICE guidelines.  Advised if no continued improvement in a week to follow-up with orthopedics as she may need an MRI of her knee but if symptoms steadily improve to just continue following RICE guidelines and follow-up with [place] as needed.  Final Clinical Impressions(s) / UC Diagnoses   Final diagnoses:  Left leg pain  Hematoma of left lower leg  Fall, initial encounter     Discharge Instructions      -X-ray does not show any fractures or bony abnormality but as we discussed it cannot 100% rule out torn ligaments or meniscus. - Continue to ice and elevate the leg and take ibuprofen and Tylenol for pain.  May apply topical Voltaren gel or topical lidocaine. -Wear the knee brace for support and stability. - If no continued improvement in the next 1 week or if symptoms worsen you may need to follow-up with orthopedics and have an MRI of the knee.  You have a condition requiring you to follow up with Orthopedics so please call one of the following office for appointment:   [facility] [address] [place], [place] [address] Phone: [phone]  [facility] [address], [place], [place] [address] Phone: [phone]      ED Prescriptions   None    PDMP not reviewed this encounter.   Shirlee Latch, PA-C 04/20/22 1801

## 2022-04-20 NOTE — ED Triage Notes (Signed)
5 days ago pt slipped while getting on a boat and her left shin struck side of boat and then the last step. Bruising noted to left lower leg and foot. States pain is much better but wants to be checked

## 2022-10-02 ENCOUNTER — Ambulatory Visit: Admit: 2022-10-02 | Payer: BC Managed Care – PPO

## 2022-10-11 IMAGING — CR DG CHEST 2V
2 series · 2 of 2 positions shown · non-contrast
Comparison: None.

CLINICAL DATA: Cough and hemoptysis

EXAM:
CHEST - 2 VIEW

[chest pa]
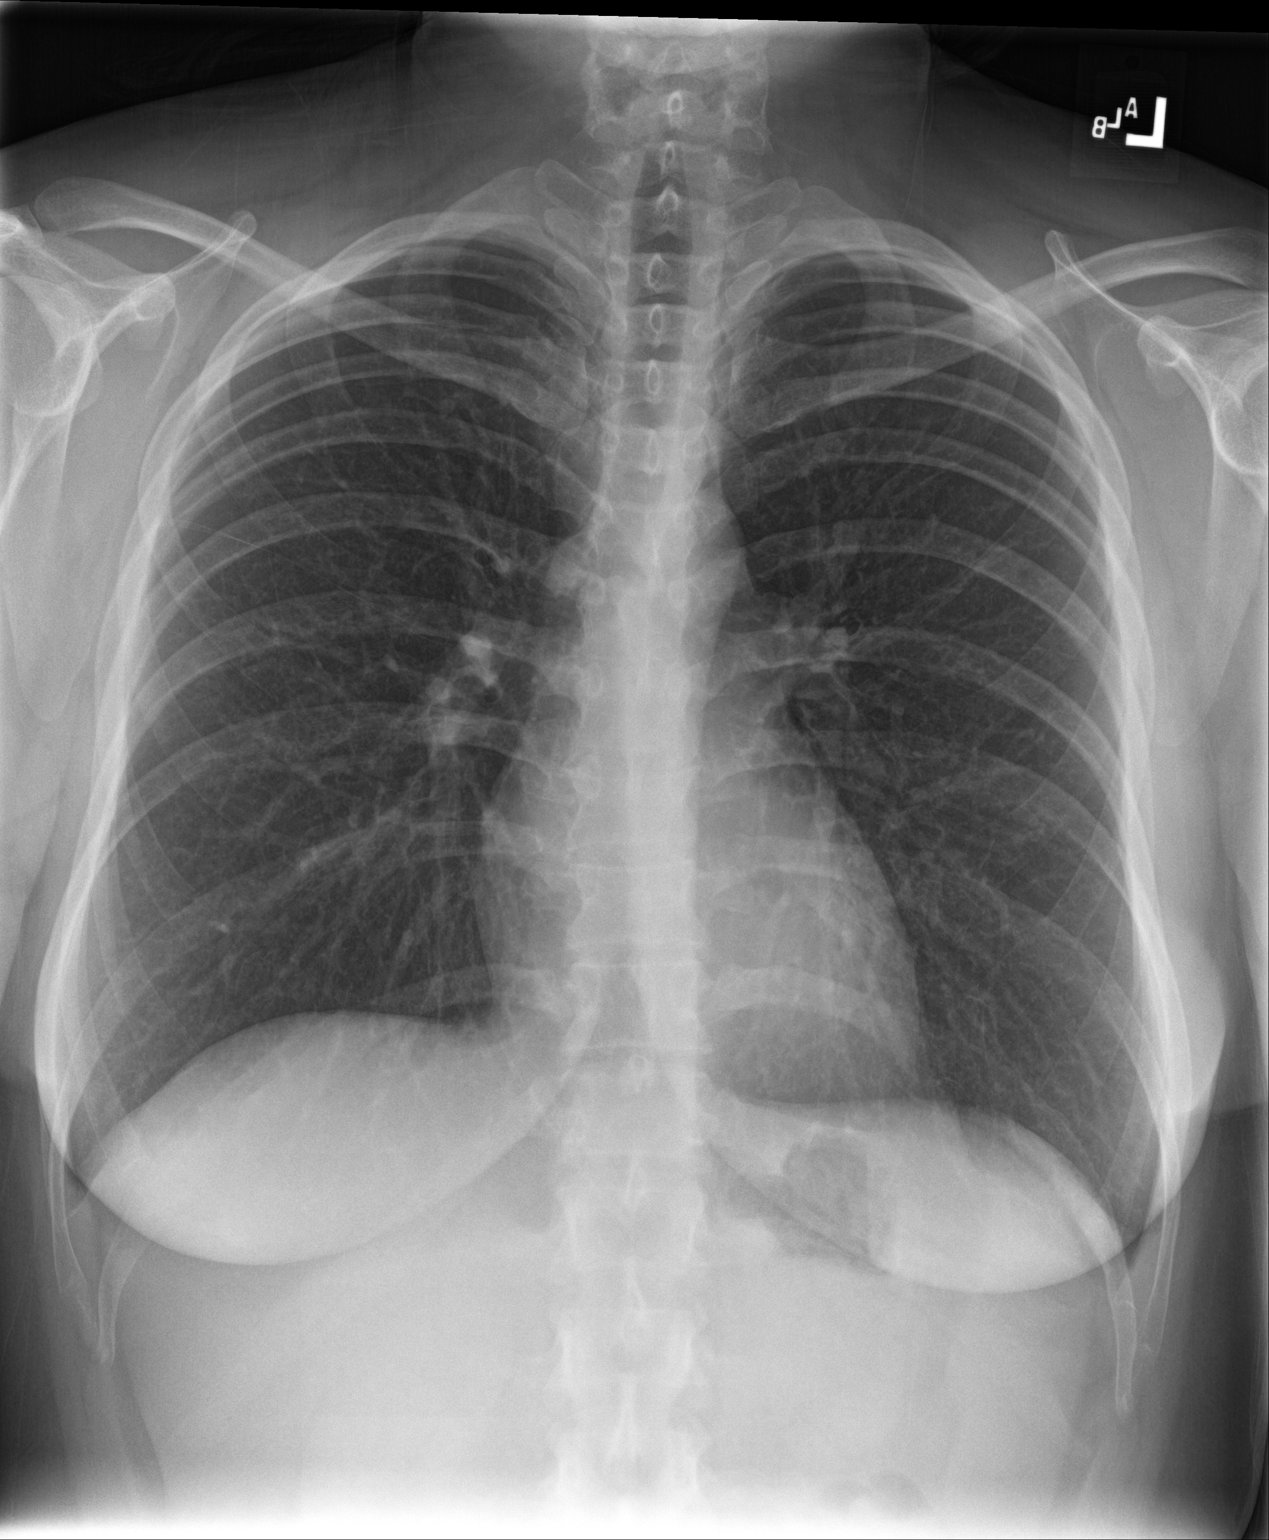

[chest lat]
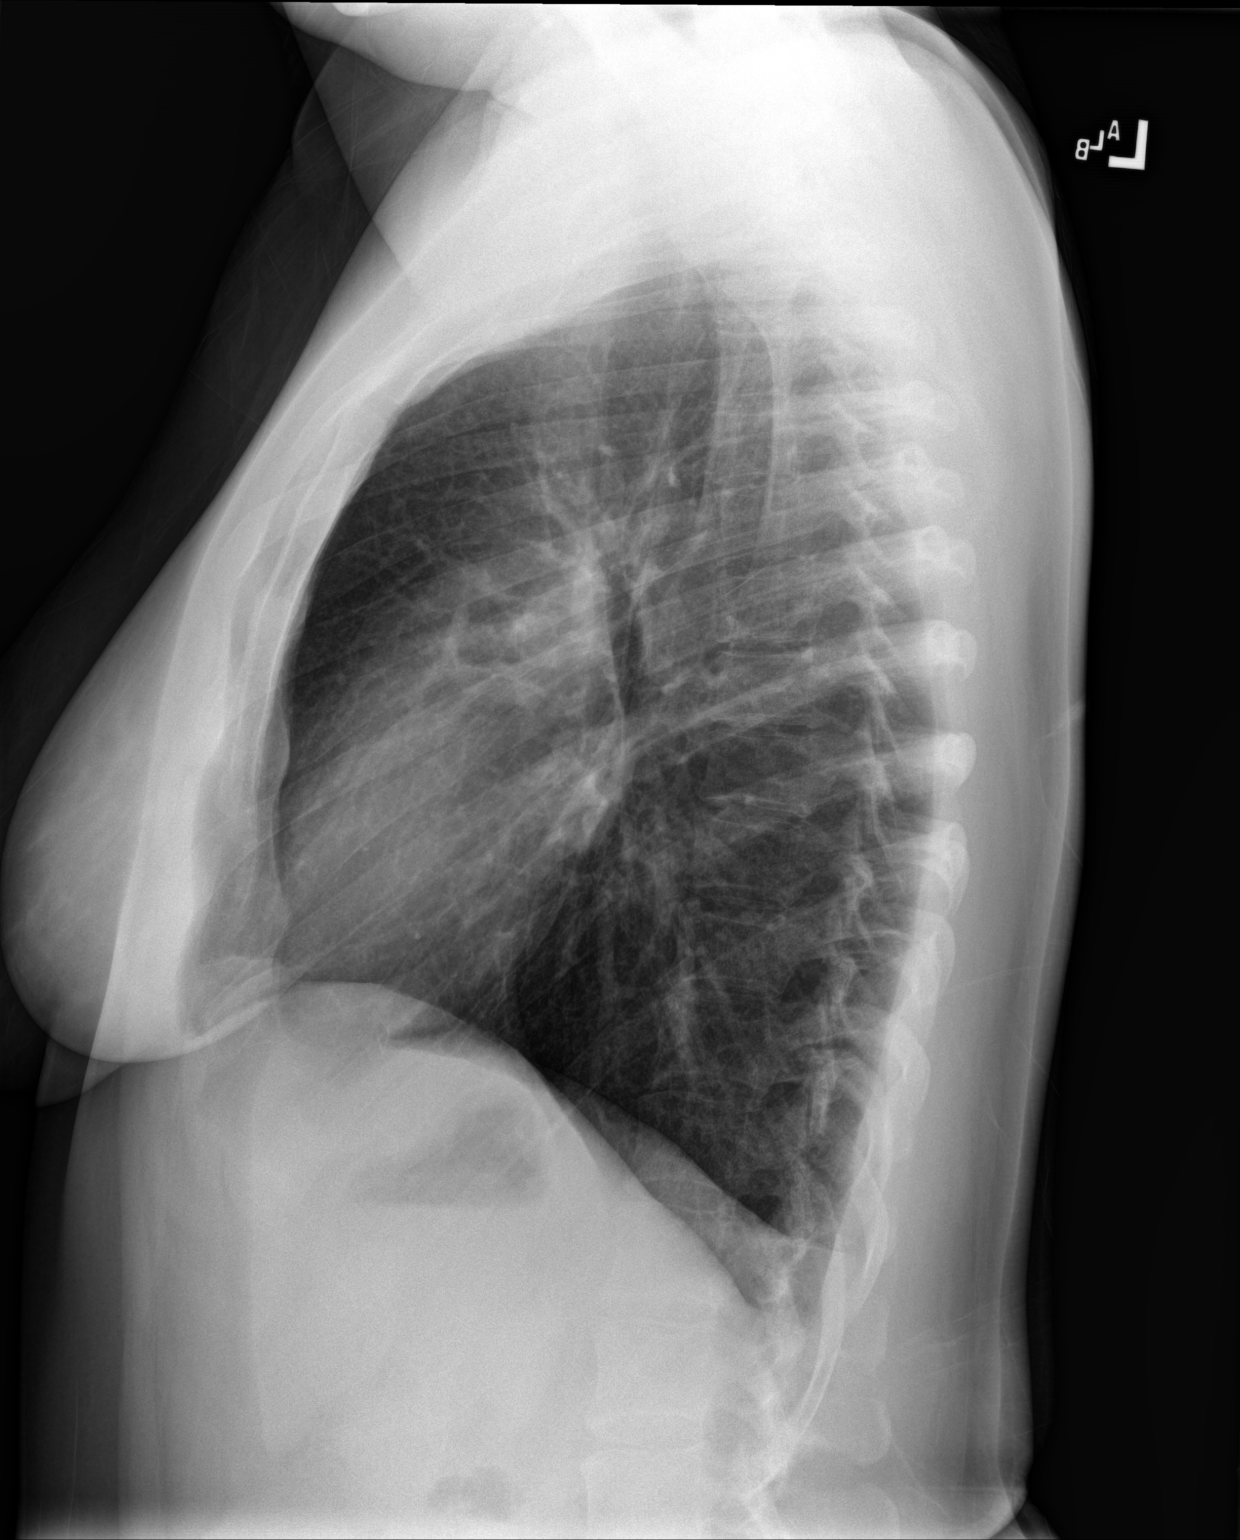

[2 of 2 positions shown; findings below may reference images not displayed]

FINDINGS: The heart size and mediastinal contours are within normal limits.
Both lungs are clear. The visualized skeletal structures are
unremarkable.
IMPRESSION: No active cardiopulmonary disease.

## 2023-12-02 ENCOUNTER — Ambulatory Visit: Payer: Self-pay | Admitting: Obstetrics

## 2023-12-02 DIAGNOSIS — Z113 Encounter for screening for infections with a predominantly sexual mode of transmission: Secondary | ICD-10-CM

## 2023-12-02 DIAGNOSIS — Z0000 Encounter for general adult medical examination without abnormal findings: Secondary | ICD-10-CM

## 2023-12-02 DIAGNOSIS — Z124 Encounter for screening for malignant neoplasm of cervix: Secondary | ICD-10-CM

## 2023-12-06 ENCOUNTER — Encounter: Payer: Self-pay | Admitting: Obstetrics
